# Patient Record
Sex: Female | Born: 1940 | Race: Black or African American | Hispanic: No | Marital: Single | Smoking: Never smoker
Health system: Southern US, Community
[De-identification: ages and names within clinical notes are randomized; demographics above are authoritative.]

## PROBLEM LIST (undated history)

## (undated) DIAGNOSIS — D649 Anemia, unspecified: Secondary | ICD-10-CM

## (undated) DIAGNOSIS — Z9289 Personal history of other medical treatment: Secondary | ICD-10-CM

## (undated) DIAGNOSIS — R1310 Dysphagia, unspecified: Secondary | ICD-10-CM

## (undated) DIAGNOSIS — J45909 Unspecified asthma, uncomplicated: Secondary | ICD-10-CM

## (undated) DIAGNOSIS — K219 Gastro-esophageal reflux disease without esophagitis: Secondary | ICD-10-CM

## (undated) DIAGNOSIS — M1990 Unspecified osteoarthritis, unspecified site: Secondary | ICD-10-CM

## (undated) DIAGNOSIS — F329 Major depressive disorder, single episode, unspecified: Secondary | ICD-10-CM

## (undated) DIAGNOSIS — F32A Depression, unspecified: Secondary | ICD-10-CM

## (undated) DIAGNOSIS — I10 Essential (primary) hypertension: Secondary | ICD-10-CM

## (undated) HISTORY — PX: EYE SURGERY: SHX253

## (undated) HISTORY — PX: TOOTH EXTRACTION: SUR596

## (undated) HISTORY — PX: ABDOMINAL HYSTERECTOMY: SHX81

---

## 1999-09-28 ENCOUNTER — Encounter: Payer: Self-pay | Admitting: Internal Medicine

## 1999-09-28 ENCOUNTER — Encounter: Admission: RE | Admit: 1999-09-28 | Discharge: 1999-09-28 | Payer: Self-pay | Admitting: Internal Medicine

## 2000-04-05 ENCOUNTER — Encounter: Payer: Self-pay | Admitting: Internal Medicine

## 2000-04-05 ENCOUNTER — Encounter: Admission: RE | Admit: 2000-04-05 | Discharge: 2000-04-05 | Payer: Self-pay | Admitting: Internal Medicine

## 2001-03-27 ENCOUNTER — Ambulatory Visit (HOSPITAL_BASED_OUTPATIENT_CLINIC_OR_DEPARTMENT_OTHER): Admission: RE | Admit: 2001-03-27 | Discharge: 2001-03-27 | Payer: Self-pay | Admitting: Internal Medicine

## 2001-04-14 ENCOUNTER — Ambulatory Visit (HOSPITAL_COMMUNITY): Admission: RE | Admit: 2001-04-14 | Discharge: 2001-04-14 | Payer: Self-pay | Admitting: Internal Medicine

## 2001-06-05 ENCOUNTER — Ambulatory Visit (HOSPITAL_COMMUNITY): Admission: RE | Admit: 2001-06-05 | Discharge: 2001-06-05 | Payer: Self-pay | Admitting: Orthopaedic Surgery

## 2001-06-05 ENCOUNTER — Encounter: Payer: Self-pay | Admitting: Orthopaedic Surgery

## 2002-01-30 ENCOUNTER — Encounter: Payer: Self-pay | Admitting: Emergency Medicine

## 2002-01-30 ENCOUNTER — Emergency Department (HOSPITAL_COMMUNITY): Admission: EM | Admit: 2002-01-30 | Discharge: 2002-01-30 | Payer: Self-pay | Admitting: Emergency Medicine

## 2002-06-11 ENCOUNTER — Encounter: Payer: Self-pay | Admitting: Emergency Medicine

## 2002-06-11 ENCOUNTER — Emergency Department (HOSPITAL_COMMUNITY): Admission: EM | Admit: 2002-06-11 | Discharge: 2002-06-11 | Payer: Self-pay | Admitting: Emergency Medicine

## 2003-05-30 ENCOUNTER — Ambulatory Visit (HOSPITAL_COMMUNITY): Admission: RE | Admit: 2003-05-30 | Discharge: 2003-05-30 | Payer: Self-pay | Admitting: Gastroenterology

## 2003-12-12 ENCOUNTER — Encounter: Admission: RE | Admit: 2003-12-12 | Discharge: 2003-12-12 | Payer: Self-pay | Admitting: Internal Medicine

## 2006-10-13 ENCOUNTER — Encounter: Admission: RE | Admit: 2006-10-13 | Discharge: 2006-10-13 | Payer: Self-pay | Admitting: Internal Medicine

## 2006-10-26 ENCOUNTER — Encounter: Admission: RE | Admit: 2006-10-26 | Discharge: 2006-10-26 | Payer: Self-pay | Admitting: Internal Medicine

## 2007-12-06 ENCOUNTER — Encounter: Admission: RE | Admit: 2007-12-06 | Discharge: 2007-12-06 | Payer: Self-pay | Admitting: Internal Medicine

## 2008-02-29 ENCOUNTER — Inpatient Hospital Stay (HOSPITAL_COMMUNITY): Admission: RE | Admit: 2008-02-29 | Discharge: 2008-03-07 | Payer: Self-pay | Admitting: Orthopaedic Surgery

## 2008-03-04 ENCOUNTER — Encounter (INDEPENDENT_AMBULATORY_CARE_PROVIDER_SITE_OTHER): Payer: Self-pay | Admitting: Orthopaedic Surgery

## 2008-03-04 ENCOUNTER — Ambulatory Visit: Payer: Self-pay | Admitting: Vascular Surgery

## 2008-04-06 ENCOUNTER — Inpatient Hospital Stay (HOSPITAL_COMMUNITY): Admission: EM | Admit: 2008-04-06 | Discharge: 2008-04-09 | Payer: Self-pay | Admitting: Emergency Medicine

## 2008-04-09 ENCOUNTER — Inpatient Hospital Stay (HOSPITAL_COMMUNITY): Admission: RE | Admit: 2008-04-09 | Discharge: 2008-04-17 | Payer: Self-pay | Admitting: Psychiatry

## 2008-04-09 ENCOUNTER — Ambulatory Visit: Payer: Self-pay | Admitting: Psychiatry

## 2008-09-20 HISTORY — PX: JOINT REPLACEMENT: SHX530

## 2009-11-18 ENCOUNTER — Encounter: Admission: RE | Admit: 2009-11-18 | Discharge: 2009-11-18 | Payer: Self-pay | Admitting: Internal Medicine

## 2010-04-07 ENCOUNTER — Encounter: Admission: RE | Admit: 2010-04-07 | Discharge: 2010-04-07 | Payer: Self-pay | Admitting: Internal Medicine

## 2010-05-02 ENCOUNTER — Emergency Department (HOSPITAL_COMMUNITY)
Admission: EM | Admit: 2010-05-02 | Discharge: 2010-05-03 | Payer: Self-pay | Source: Home / Self Care | Admitting: Emergency Medicine

## 2010-10-11 ENCOUNTER — Encounter: Payer: Self-pay | Admitting: Internal Medicine

## 2011-02-02 NOTE — H&P (Signed)
[name], [name]              ACCOUNT NO.:  [id number]   MEDICAL RECORD NO.:  [id number]          PATIENT TYPE:  IPS   LOCATION:  0300                          FACILITY:  [facility]   PHYSICIAN:  Geoffery Lyons, M.D.      DATE OF BIRTH:  07-18-1941   DATE OF ADMISSION:  04/09/2008  DATE OF DISCHARGE:                       PSYCHIATRIC ADMISSION ASSESSMENT   IDENTIFYING INFORMATION:  A 70 year old African American female who is  single.  This is a voluntary admission.   HISTORY OF PRESENT ILLNESS:  First psychiatric admission for this 18-  year-old who had a previous history of depression and more recently has  been in crisis since her employer discharged her after she had a total  knee replacement.  Admitted to [facility] on July 18 after  taking an overdose of Coumadin, Norvasc and benzodiazepines.  She says  today it was stupid referring to the overdose but admits that she has  been very depressed and upset.  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  She admits that she is devastated  and upset that her employer will not allow her to return, in fact, has  employed someone else.  She says that this was a shock to her, that the  employer had been hinting around at this for about 3 months, but she did  not expect that she would be completely terminated.  She receives a  little bit of money and social security every month but not enough to  live on and is not sure how she is going to survive.  She endorses for  the past 2-3 weeks frequent crying episodes, hopelessness, some  agitation and anger towards her employer, and anhedonia.  Having  difficulty eating or sleeping well but denies losing any weight.  She  denies active suicidal thoughts today.  No history of substance abuse.  She admits that she had some fleeting homicidal thoughts towards her  employer but none today.   PAST PSYCHIATRIC HISTORY:  The patient previously treated in the past  for major depression, has received psychiatric care in the past from Dr.  Excell Seltzer here in [place] and then Willey Blade, her primary care  practitioner, in the past.  Has taken Prozac over the years and most  recently had Wellbutrin added by her current primary care physician, Dr.  Kirby Funk.  One prior suicide attempt many years ago.   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  Has 1 sister in [place] and is  particularly close to her older sister who lives in [place].  Also  close to her brother who lives in [place] along with her sister-in-  Social worker.  No current legal problems.  Does have her own home with limited  Social Security income.   FAMILY HISTORY:  Denies a family history of depression and substance  abuse.   MEDICAL HISTORY:  PCP is Dr. Kirby Funk.  Also cared for by Dr.  Whitfield, her orthopedist.   CURRENT MEDICAL PROBLEMS:  1. 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  2. Osteoarthritis.  3. History of acute renal failure.  4. Some hypertension.  5. Post polypharmacy overdose.   CURRENT MEDICATIONS:  1. Prozac 40 mg daily.  2. Recently added Wellbutrin XL 150 mg daily.  3. Tramadol 50 mg q.6.h p.r.n. for knee pain.  4. Recently discontinued oxycodone for knee pain.  5. Symbicort 160/4.5 two inhalations b.i.d.  6. Flonase 1 puff each nostril daily.  7. Amlodipine 5 mg daily.  8. Lisinopril hydrochlorothiazide 20 mg/12.5 mg daily.  9. K-Dur 10 mg daily.  10.Tranxene 3.75 mg not daily, occasionally will take 1 at night for      insomnia.   PHYSICAL EXAMINATION:  Was done on the medical unit and is documented in  the record.   DIAGNOSTIC STUDIES:  She was initially hypokalemic.  Her metabolic panel  now normal.  Her previous INR was elevated on admission since she had  overdosed on the Coumadin but is not currently taking it.   She was  treated with oral vitamin K and INR normalized.  Routine urinalysis  within normal limits.  Urine drug screen positive for benzodiazepines,  negative for all other substances.  Alcohol level less than 5 on  admission and salicylate level less than 4.   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  Mood is depressed, grieving loss  of the relationship.  Says regarding the overdose that it was stupid.  Denies active suicidal thoughts today.  Does not want to talk any more  to the employer; says she still struggling with the grief.  No active  suicidal or homicidal thoughts today.  Thinking is logical, coherent,  goal directed.  No guarding.  No flight of ideas or signs of confusion.  No evidence of psychosis.  Immediate, recent remote memory are intact.  Insight is good.  She regrets the overdose.   AXIS I:  Major depression recurrent, severe.  AXIS II:  No diagnosis.  AXIS III:  Status post polypharmacy overdose post total knee replacement  1 month ago.  Hypertension.  AXIS IV:  Severe issues with employment issues, relationship conflicts  and caregiving stress.  AXIS V:  Current is 49; past year estimated at 29.   PLAN:  Is to admit her to help alleviate any suicidal thoughts, help  stabilizer her.  Will hope to get a family session with her sister, and  she has agreed to allow [place] to call her.  On the medical unit, they had  discontinued the hydrochlorothiazide and the potassium and continued  only the lisinopril, and now her blood pressure continues to be normal  on lisinopril only which we will continue.  We are er going to continue  her Prozac 40 mg daily along with the Wellbutrin XL.  Estimated  length  of stay is 5 days.     Margaret A. Scott, N.P.      Geoffery Lyons, M.D.  Electronically Signed   MAS/MEDQ  D:  04/10/2008  T:  04/10/2008  Job:  [id number]

## 2011-02-02 NOTE — Op Note (Signed)
[name], [name]              ACCOUNT NO.:  [id number]   MEDICAL RECORD NO.:  [id number]          PATIENT TYPE:  INP   LOCATION:  5036                         FACILITY:  [facility]   PHYSICIAN:  Claude Manges. Whitfield, M.D.DATE OF BIRTH:  July 26, 1941   DATE OF PROCEDURE:  02/29/2008  DATE OF DISCHARGE:                               OPERATIVE REPORT   PREOPERATIVE DIAGNOSIS:  End-stage osteoarthritis, right knee.   POSTOPERATIVE DIAGNOSIS:  End-stage osteoarthritis, right knee.   PROCEDURE:  Computer-assisted right total knee replacement.   SURGEON:  Claude Manges. Cleophas Dunker, MD.   ASSISTANT:  Karie Chimera, PA-C.   ANESTHESIA:  General with supplemental femoral nerve block.   COMPLICATIONS:  None.   COMPONENTS:  DePuy LCS standard femoral component, a #2.5 tibial tray  with a 12.5-mm bridging bearing, and a metal backed three peg rotating  patella over secured polymethyl methacrylate.   PROCEDURE:  The patient was met in the holding area.  The right lower  extremity was marked as the appropriate operative extremity.  Any  questions were answered.   The patient was then taken to room 4, where she was placed under general  orotracheal anesthesia.  Nursing staff inserted a Foley catheter with  clear yellow urine.  She did receive a preoperative femoral nerve block.   Tourniquet was then applied.  The leg was prepped with Betadine scrub  and DuraPrep from the tourniquet to the midfoot.  Sterile draping was  performed.   The extremity still elevated with Esmarch, exsanguinated with the  proximal tourniquet at 350 mmHg.   A midline longitudinal incision was made centered about the patella  extending from the superior pouch of the tibial tubercle via a sharp  dissection.  Incision carried down to the subcutaneous tissue.  The  first layer of capsule was incised in midline.  A medial parapatellar  incision was then made to the deep capsule with the Bovie.  The patella  was everted 180  degrees and the knee flexed to 90 degrees.  There were  large osteophytes along the medial and lateral femoral condyle.  There  was completely absence of articular cartilage of both femoral condyles.  The lateral femoral condyle onto the lateral tibial plateau onto a great  extent the medial tibial plateau.  The patient also had about 15 degrees  of valgus, but I could correct it to neutral.   We elected to use computer assistance.   Two Shantz pins were then placed in the distal femur and two in the  proximal tibia.  The computer arrays were then applied.   We initially established the normal anatomic axis and then our focal  points were morphed in the tibia and femur.  We had templated a standard  femoral component to computer agree.  We also determined that we had a  #2.5 tibial tray and the computer agree with that.   Initial cut was then made transversing the proximal tibia based on the  computer points.  The verifier was applied with an excellent cut in 7  degrees of posterior declination.  We then established flexion and  extension gaps.  We were tight laterally because of the valgus position  and lateral release was performed below the tibia and then aligning  flexion and extension gaps within a millimeter.   Subsequent cuts were then made on the femur based on the computer points  and we checked with the verifier at each step.  The final tapering cuts  were then made on the tibia.   Retractors were then placed about the tibia.  Center hole was then made  followed by the keeled cut.  We tried a #3 and then a 2.5 tibial tray to  2.5 with an anatomic fit.  The center hole was then made followed by the  keel cut.  The #12.5 polyethylene bearing was then inserted followed by  the standard femoral component, and through a full range of motion, we  had full extension, no opening with varus or valgus stress.  We did not  appear to be tight medially or laterally.   Prior to  performing the tibial cuts, I did insert the lamina spreaders  on both sides of the joint and removed any remnants of medial and  lateral menisci as well as ACL and PCL.  There were no osteophytes  behind either femoral condyle.   The patella was then prepared by removing 10 mm of bone leaving 12 mm of  patella thickness.  The patella jig was applied.  Three holes made.  The  trial patella was applied and then through full range of motion did not  sublux medially or laterally.   The trial components removed.  We used FloSeal behind in the posterior  aspect of the knee and then inserted the final components.  The 2.5  tibial tray was impacted with polymethyl methacrylate.  Extraneous  methacrylate was removed from around its periphery.  The 12.5-mm  polyethylene bridging bearing was inserted followed by the cemented  standard femoral component.  Again, extraneous methacrylate was removed  from the femur.   The patella was applied with methacrylate and the patellar clamp.  The  knee was placed in extension and the methacrylate was allowed to mature.  Joint was then inspected.  Patellar clamp was removed and any further  hardened extraneous methacrylate was removed with an osteotome.  FloSeal  was then placed around the joint.  We injected Marcaine with epinephrine  into the deep capsule.  After approximately 5 minutes, the tourniquet  was deflated.  We had very nice hemostasis.  We had nice capillary  refill to all the joint surfaces and skin edges.  The Shantz pins were  removed.  Drill holes were irrigated with saline solution.   Any further bleeding was controlled with the Bovie.   The deep capsule was then closed with interrupted #1 Ethibond.  There  was partial avulsion of the patellar tendon, so there we inserted a  single two-prong Mitek anchor.  Deep capsular closure was then continued  with #1 Ethibond.   The superficial capsule closed with running 0 Vicryl, subcu with 2-0   Vicryl, and skin closed with skin clips.  Sterile bulky dressing was  applied followed by the patient's support stocking.   The patient tolerated the procedure well without complications.      Claude Manges. Cleophas Dunker, M.D.  Electronically Signed     PWW/MEDQ  D:  02/29/2008  T:  03/01/2008  Job:  [id number]

## 2011-02-02 NOTE — Discharge Summary (Signed)
[name], [name]              ACCOUNT NO.:  [id number]   MEDICAL RECORD NO.:  [id number]          PATIENT TYPE:  INP   LOCATION:  5036                         FACILITY:  [facility]   PHYSICIAN:  Claude Manges. Whitfield, M.D.DATE OF BIRTH:  1941/03/18   DATE OF ADMISSION:  02/29/2008  DATE OF DISCHARGE:  03/07/2008                               DISCHARGE SUMMARY   ADMISSION DIAGNOSIS:  Osteoarthritis of the right knee.   DISCHARGE DIAGNOSES:  1. Osteoarthritis of the right knee.  2. History of asthma.  3. History of hypertension.  4. History of sleep apnea.  5. History of anxiety.  6. Acute blood loss anemia.  7. Esophageal reflux.  8. Acute renal failure.  9. Hypertension.  10.Hypokalemia.   PROCEDURE:  Right total knee arthroplasty.   HISTORY:  [name] is a very pleasant 70 year old African American female  with chronic right knee pain for several years.  She had arthroscopic  debridement 6 years ago.  Recently, her pain is now severe, constant,  throbbing and aching.  It is not relieved by narcotics.  She has failed  with conservative treatment.  Has radiographic end-stage OA with marked  valgus deformity.  Now indicated for right total knee arthroplasty.   HOSPITAL COURSE:  A 70 year old African American female admitted February 29, 2008, and after appropriate laboratory studies were obtained as well  as 2 grams of Ancef IV on-call to the operating room, was taken to the  operating room where she underwent a right total knee arthroplasty.  She  tolerated the procedure well.  She was placed on Dilaudid PCA reduced  dose pain management system.  Started on Lovenox 30 mg subcu q.12 h.  Starting on March 01, 2008, at 8:00 a.m.  Coumadin per pharmacy protocol.  Foley was placed intraoperatively.  CPM placed 0-60 degrees for 6-8  hours per day increasing 10 degrees per day.  Consult PT was obtained,  partial weightbearing 50%.  Dr. Kirby Funk was consulted and [facility]  assumed medical care.  She was noted to have what appeared  to be some renal changes.  She initially was started with a GFR greater  than 60.  On the March 01, 2008, it dropped to 37 and then went to 20 as  well.  At the time of her discharge though it was back to greater than  60.  On the March 01, 2008, her hydrochlorothiazide, as well as her  lisinopril was held.  Norvasc was held if systolic pressure less than  100.  IV fluid was changed to normal saline at 100 an hour.  IV rate was  increased to 125 mL/hour on the March 02, 2008.  Her dressing was  changed.  Her wound was stable.  She was also started on iron sulfate  325 b.i.d.  The PCA was discontinued.  On the March 03, 2008, her IV rate  was decreased to 75.  She was given MiraLax 17 g daily through water and  she was given 1 unit of packed cells during her hospital course.  She  was also given a second  unit on the March 03, 2008, packed RBCs.  Her  Lovenox was discontinued on the March 03, 2008, when her protime became  prophylactic.  Doppler was ordered on the March 04, 2008, and a  urinalysis was obtained from the Foley.  Her lisinopril was restarted  and her Foley was discontinued and the IV was saline lock on the March 04, 2008.  She did have constipation, and she was tried with MiraLax, as  well as a Dulcolax suppository.  Hypokalemia also occurred on March 05, 2008, and she was given 20 mEq of KCl p.o. b.i.d..  She was then allowed  to bear full weight on her right leg.  The remainder of her hospital  course was uneventful and it was felt that she was medically stable and  was able to be discharged to home on the March 07, 2008.  EKG was read as  normal sinus rhythm, normal EKG.   LABORATORY STUDIES:  Hemoglobin 12.9, hematocrit 8.1%, white count 6900,  and platelets 262,000.  She dropped to a hemoglobin of 8.0 and  hematocrit 23.1% on the March 03, 2008.  Discharge hemoglobin 10.9,  hematocrit 32.2%, white count 8400, and  platelets 233,000.  Initial  protime 12.6, INR 0.9, PTT 27.  Discharge protime 34.3, INR 3.2.  Preop  sodium 141, potassium 3.2, chloride 104, CO2 29, glucose 80, BUN 12,  creatinine 1.03.  She dropped to a potassium of 3.1 on the March 05, 2008.  Her creatinine was 1.65 on the March 01, 2008, and 2.85 on the  March 02, 2008, with a BUN of 32.  On the March 03, 2008, her creatinine  lowered to 1.78, the BUN was 22 and then she had normal values from that  date.  Discharge sodium 142, potassium 4.1, chloride 107, CO2 28,  glucose 99, BUN 11, and creatinine 0.96.  GFR is as noted above started  at greater than 60 preoperatively, dropped to 20 on the March 02, 2008,  and returned to normal by the time of her discharge greater than 60.  Preop total protein 6.8, albumin 3.7, AST 18, ALT 13, ALP 76, and total  bilirubin 0.5.  Urinalysis February 22, 2008, revealed positive nitrites,  moderate leukocyte esterase, rare epithelium, 7-10 whites, 0-2 reds,  many bacteria.  Urinalysis of February 29, 2008, revealed trace of leukocyte  esterase, rare epithelium, 0-2 rare wbc clusters, 0-2 red cells.  No  bacteria noted.  On March 04, 2008, urinalysis showed small amount of  hemoglobin, protein at 30, trace leukocyte esterase, rare epithelium, 0-  2 whites, 3-6 reds and rare bacteria.  Urine culture February 22, 2008,  revealed greater than 1000 colonies of E-coli sensitive to cefazolin.  Urine culture February 29, 2008 revealed no growth.  Culture of February 29, 2008, again showed no growth.   DISCHARGE INSTRUCTIONS:  Low sodium heart healthy diet was indicated.  Keep her incision clean and dry.  Cover with sterile dressings.  Increase her activity slowly.  Use a walker.  Weightbearing as  tolerated.  CPM 0 to 80 degrees for 6-8 hours per day and then increase  5-10 degrees per day.  May shower.  No lifting or driving for 6 weeks.  Prescription for Coumadin 5 mg as directed by advanced pharmacist,  Percocet 5/325 one to two  tabs every 4 hours as needed for pain, Robaxin  500 mg p.o. q.8 h. p.r.n. spasms, Colace 100 mg b.i.d., MiraLax 17 g  daily  as needed for constipation.  Follow up with Dr. Cleophas Dunker on March 13, 2008.   DISCHARGE:  Good condition.      Oris Drone Petrarca, P.A.-C.      Claude Manges. Cleophas Dunker, M.D.  Electronically Signed    BDP/MEDQ  D:  04/02/2008  T:  04/02/2008  Job:  [id number]

## 2011-02-02 NOTE — H&P (Signed)
[name], [name]              ACCOUNT NO.:  [id number]   MEDICAL RECORD NO.:  [id number]          PATIENT TYPE:  INP   LOCATION:  0101                         FACILITY:  [facility]   PHYSICIAN:  Hollice Espy, M.D.DATE OF BIRTH:  [birth year]   DATE OF ADMISSION:  04/06/2008  DATE OF DISCHARGE:                              HISTORY & PHYSICAL   PRIMARY CARE PHYSICIAN:  Thora Lance, M.D.   CHIEF COMPLAINT:  Overdose.   HISTORY OF PRESENT ILLNESS:  The patient is a 70 year old African  American female with past medical history of hypertension, depression  and a recent total knee replacement done, for which she was put on  Coumadin.  She says that the last few weeks have been rather stressful  following her knee surgery and then she had a sister who was staying  with her for the first week after her knee surgery, which was good, but  then after she left she started getting very depressed.  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  Because of this termination, she felt that this was just the  final straw and she could not take it anymore and did not want to live,  and so she took a bunch of medications.  These appear to be, she took a  number of Coumadin pills as well as Norvasc and benzodiazepines of  unknown number.  As soon as she stated that she took it, she felt that  this was the wrong thing to do and she came into the emergency room.  In  the emergency room she had her INR checked.  It was found to be 2.0.  However, her INR has been followed on a daily basis and her previous INR  1 day ago was 3.2. The rest of her labs were essentially unremarkable.  Her urine drug screen was positive for benzodiazepine, negative for  opiates.  The ER attending spoke with Poison Control, who recommended  p.o. vitamin K and continued monitoring.  Currently the patient is  doing  better.  She is somewhat tearful about the whole of the event.  Denies  any headaches, vision changes, dysphagia, chest pain, palpitations,  shortness of breath, wheeze, cough, abdominal pain, hematuria, dysuria,  constipation, diarrhea, focal extremity numbness, weakness or pain other  than some right knee pain from where her surgery was.   REVIEW OF SYSTEMS:  Otherwise negative.   PAST MEDICAL HISTORY:  Includes status post recent total knee done  several weeks ago, hypertension, depression.   MEDICATIONS:  She is on Norvasc 5, Wellbutrin 150, clorazepate 3.375  p.o. p.r.n., Prozac 40, lisinopril/hydrochlorothiazide 20/12.5 p.o.  daily, methocarbamol 500 p.o. daily p.r.n., Percocet 5/325 p.o. b.i.d.,  K-Dur 10 mg p.o. daily.   ALLERGIES:  NO KNOWN DRUG ALLERGIES.   SOCIAL HISTORY:  She denies any tobacco, alcohol or drug use.   FAMILY HISTORY:  Noncontributory.   PHYSICAL EXAMINATION:  VITAL SIGNS:  The patient's vitals on admission,  temperature 99.8, heart 81, blood pressure 115/69, respirations 13, O2  sat 97% on room air.  GENERAL:  She is alert and oriented x3.  She is somewhat tearful.  HEENT:  Normocephalic atraumatic.  Mucous membranes are moist.  She has  no carotid bruits.  HEART:  Regular rate rhythm.  S1 and S2.  LUNGS:  Clear to auscultation bilaterally.  ABDOMEN:  Soft, obese, nontender, positive bowel sounds.  EXTREMITIES:  Left lower extremity, about a trace pitting edema, right  is about a 1+ pitting edema.   LAB WORK:  White count 6.3, H and H 11.1 and 33, MCV of 87, platelet  count 257, no shift.  Sodium 142, potassium 3, chloride 107, bicarb 26,  BUN 9, creatinine 1.18, glucose 118.  LFTs are noted for an albumin of  3, alcohol level less than 5.  INR is 2.  Previous INR 1 day ago was  3.2.  UA is negative.  Urine drug screen is positive for  benzodiazepines.  Tylenol and salicylate levels are within normal  limits.   ASSESSMENT AND PLAN:  1.  Suicide attempt where the patient took intentional overdose of      Norvasc and Coumadin, and possibly benzodiazepines.  Telemetry bed,      suicide precautions, 24-hour sitter.  I have consulted psychiatry,      who will come see the patient.  Will follow the patient's PT/INR      and treat with vitamin K.  2. Hypertension.  Holding p.o. medicines.  3. Recent knee surgery.  4. Hypokalemia.  Will replace.  Hollice Espy, M.D.  Electronically Signed     SKK/MEDQ  D:  04/06/2008  T:  04/06/2008  Job:  [id number]   cc:   Thora Lance, M.D.  Fax: [phone]   Claude Manges. Cleophas Dunker, M.D.  Fax: [phone]

## 2011-02-02 NOTE — Discharge Summary (Signed)
NAMEVASTIE, [name]              ACCOUNT NO.:  [id number]   MEDICAL RECORD NO.:  [id number]          PATIENT TYPE:  INP   LOCATION:  1503                         FACILITY:  [facility]   PHYSICIAN:  Thora Lance, M.D.  DATE OF BIRTH:  1941/03/18   DATE OF ADMISSION:  04/06/2008  DATE OF DISCHARGE:  04/09/2008                               DISCHARGE SUMMARY   REASON FOR ADMISSION:  A 70 year old African female who had been  terminated from long term employment had become depressed and had taken  a number of Coumadin as well as Norvasc and benzodiazepines in a suicide  attempt.  She then came to the emergency room after realizing she had  done the wrong thing.  She was given vitamin K p.o. in the emergency  room and admitted to the hospital.   SIGNIFICANT FINDINGS:  Blood pressure 115/69.  Heart rate 81.  Temperature 98.8.  LUNGS:  Clear.  HEART:  Regular rate and rhythm.  EXTREMITIES:  Showed trace edema.   LABORATORY WORK:  WBC 6.3, hemoglobin 11.1, platelet count 257, sodium  142, potassium 3, chloride 107, bicarbonate 26, BUN 9, creatinine 1.18,  glucose 118, alcohol level less than 5, INR was 2.  Urine drug screen  positive for benzodiazepines.  Tylenol, salicylate level is normal.   HOSPITAL COURSE:  The patient was admitted with a suicide attempt.  She  was monitored on telemetry and given IV fluids.  Her antihypertensives  and antidepressants were held.  Her Coumadin was continued which had  been taken temporarily post a knee replacement.  The patient had a 24-  hour sitter with her during the hospitalization.  She remained medically  stable with blood pressure remaining in the normal/low range but not  below 90.  Her antihypertensives were not yet restarted.  She was  restarted on her antidepressants at discharge.  Her telemetry was  normal.  Her potassium was repleted p.o.  Her asthma remained stable on  her outpatient bronchodilator/steroid combination.  She was seen by  Dr.  Jeanie Sewer of psychiatry who felt that she still had significant suicide  risk and recommended transfer to the psychiatric service when medically  stable.   DISCHARGE DIAGNOSES:  1. Drug overdose.  2. Depression.  3. Hypertension.  4. Asthma.  5. Status post knee replacement.  6. Overactive bladder.  7. Allergic rhinitis.   PROCEDURES:  None.   DISCHARGE MEDICATIONS:  1. Fluoxetine 40 mg every day.  2. Wellbutrin XL 150 mg every day.  3. Tramadol 50 mg 1 p.o. q.6 p.r.n. for pain.  4. Symbicort 160/4.5 2 inhalations b.i.d.  5. Flonase each nostril every day.  6. Amlodipine 5 mg every day and lisinopril-hydrochlorothiazide 25 mg      every day were held at discharge as blood pressure was still      112/70.  As blood pressure rises greater than 140/90, these      antihypertensives should be restarted.  7. Coumadin was discontinued.   DISPOSITION:  To [facility].   DIET:  Low sodium diet.   ACTIVITY:  As tolerated.  FOLLOWUP:  With Dr. Valentina Lucks at next regularly scheduled appointment.           ______________________________  Thora Lance, M.D.     JJG/MEDQ  D:  04/09/2008  T:  04/09/2008  Job:  [id number]

## 2011-02-02 NOTE — Consult Note (Signed)
NAMEKENISHIA, PLACK              ACCOUNT NO.:  [id number]   MEDICAL RECORD NO.:  [id number]          PATIENT TYPE:  INP   LOCATION:  1503                         FACILITY:  [facility]   PHYSICIAN:  Antonietta Breach, M.D.  DATE OF BIRTH:  1940/10/27   DATE OF CONSULTATION:  04/08/2008  DATE OF DISCHARGE:                                 CONSULTATION   REQUESTING PHYSICIAN:  Kirby Funk.   HISTORY OF PRESENT ILLNESS:  [name] is a 70 year old female  admitted to the [facility] on April 06, 2008, due to an  overdose.   Ms. Trautmann has been experiencing a number of stresses.  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   The patient has developed a return of depressive symptoms over the past  week including easy crying, hopelessness, anhedonia, depressed mood.   On April 06, 2008, she took an overdose of a number of medications  including Norvasc and Coumadin with the goal of killing herself.   She continues with the same depressive symptoms.   PAST PSYCHIATRIC HISTORY:  Ms. Phebus does have a history of a prior  suicide attempt.  She began to experience major depression approximately  10 years ago.  She has required at least 3 psychiatric admissions.   Her medication treatment over the years has involved Prozac, augmented  with Wellbutrin.   In review of the past medical record, anxiety is also listed.   The patient's psychotropic medications at the time of admission involved  Prozac 40 mg q. day, Wellbutrin 150 mg q. day and  clorazepate 3.375 mg  p.r.n.   FAMILY PSYCHIATRIC HISTORY:  None known.   SOCIAL HISTORY:  Please see the above.  The patient does not use alcohol  or illegal drugs.  She has a sister in [place] who is very supportive.  The patient is single.  She has no children.  The patient does have  strong church support.   PAST MEDICAL HISTORY:  1. Status post total knee replacement approximately 2 months ago.  2. Hypertension.  3. The patient was taking Coumadin initially after the knee      replacement; however, the Coumadin that she overdosed on was      leftover Coumadin.  She was not requiring ongoing Coumadin by her      general medical healthcare providers   MEDICATIONS:  The MAR is reviewed.  The patient currently is on Xanax  0.5 mg q.6 hours p.r.n.   ALLERGIES:  ASPIRIN.   LABORATORY DATA:  Sodium 143, BUN 6, creatinine 0.89, glucose 102, INR  2.5 which is an increase currently from  a previous INR.  SGOT 13, SGPT  9, WBC 5.2, hemoglobin 10.4, platelet count 229.  Urine drug screen was  positive for benzodiazepines only.  Aspirin, alcohol, Tylenol all  negative.   REVIEW OF SYSTEMS:  CONSTITUTIONAL:  HEAD, EYES, EARS, NOSE AND THROAT,  MOUTH, NEUROLOGIC, PSYCHIATRIC, CARDIOVASCULAR, RESPIRATORY,  GASTROINTESTINAL, GENITOURINARY, SKIN, MUSCULOSKELETAL, HEMATOLOGIC,  LYMPHATIC, ENDOCRINE, METABOLIC all unremarkable.   PHYSICAL EXAMINATION:  VITAL SIGNS:  Temperature 98.5.  Pulse 77.  Respiratory rate 18.  Blood pressure 131/80.  O2 saturation on room air  98%.  GENERAL APPEARANCE:  Ms. Willette is an elderly female lying in a supine  position in her hospital bed with no abnormal involuntary movements.  MENTAL STATUS EXAM:  Ms. Buescher is alert, she is oriented to all  spheres.  Her attention span is mildly decreased.  Her concentration is  mildly decreased.  Her affect is constricted.  Her mood is depressed.  She cries often during the interview.  Memory is intact to  immediate  recent and remote.  Her fund of knowledge and intelligence are within  normal limits.  Her speech involves normal rate and prosody.  There is  no dysarthria.  Thought process logical, coherent, goal-directed.  No  looseness of associations.  Thought content, the patient acknowledges  suicidal intent.  She has no hallucinations or delusions.  Insight is  intact to the need of treatment for depression.  Her judgment is  impaired for living outside of the supportive environment of the  hospital.   ASSESSMENT:  Axis I:  293.83 mood disorder not otherwise specified,  depressed.  Rule out major depressive disorder recurrent severe 296.33.  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  Axis II:  Deferred.  Axis III:  See general medical in the past medical history above.  Axis IV:  General medical primary support group occupational.  Axis V:  30.   Ms. Heatherly is still at risk to harm herself.   The undersigned provided ego supportive psychotherapy and education.   RECOMMENDATIONS:  1. Would continue suicide precautions.  2. Psychotropic medications deferred.  3. Would admit to an inpatient psychiatric unit for further evaluation      and treatment as soon as possible when the patient is medically      cleared.      Antonietta Breach, M.D.  Electronically Signed     JW/MEDQ  D:  04/08/2008  T:  04/08/2008  Job:  [id number]

## 2011-02-05 NOTE — Op Note (Signed)
NAME:  [name], [name]                        ACCOUNT NO.:  [id number]   MEDICAL RECORD NO.:  [id number]                   PATIENT TYPE:  AMB   LOCATION:  ENDO                                 FACILITY:  [facility]   PHYSICIAN:  Danise Edge, M.D.                DATE OF BIRTH:  Sep 01, 1941   DATE OF PROCEDURE:  05/30/2003  DATE OF DISCHARGE:                                 OPERATIVE REPORT   PROCEDURE PERFORMED:  Esophagogastroduodenoscopy and colonoscopy.   ENDOSCOPIST:  Charolett Bumpers, M.D.   INDICATIONS FOR PROCEDURE:  Ms. [name] is a 70 year old female born  Dec 12, 1940.  Ms. [name] is scheduled to undergo a diagnostic  esophagogastroduodenoscopy to evaluate chronic indigestion and  gastroesophageal reflux.  She is also scheduled to undergo a screening  colonoscopy with polypectomy to prevent colon cancer.   PREMEDICATION:  Versed 10 mg, Demerol 50 mg.   PROCEDURE:  Esophagogastroduodenoscopy.  After obtaining informed consent,  the patient was placed in the left lateral decubitus position.  I  administered intravenous Demerol and intravenous Versed to achieve conscious  sedation for the procedure.  The patient's blood pressure, oxygen  saturations and cardiac rhythm were monitored throughout the procedure and  documented in the medical record.   The Olympus gastroscope was passed through the posterior hypopharynx into  the proximal esophagus without difficulty.  The hypopharynx, larynx, and  vocal cords appeared normal.   Esophagoscopy:  The proximal, mid and lower segments of the esophageal  mucosa appeared completely normal.   Gastroscopy:  Retroflex view of the gastric cardia and fundus was normal.  The gastric body, antrum and pylorus appeared normal.   Duodenoscopy:  The duodenal bulb and mid duodenum and distal duodenum  appeared normal.   ASSESSMENT:  Normal esophagogastroduodenoscopy.   PROCEDURE:  Proctocolonoscopy.  Anal inspection was  normal.  Digital rectal  exam was normal.  The Olympus adjustable pediatric colonoscope was  introduced into the rectum and advanced to the cecum.  Colonic preparation  for the exam today was satisfactory.   Rectum:  Normal.   Sigmoid colon and descending colon:  Left colonic diverticulosis.   Splenic flexure:  Normal.   Transverse colon:  Normal.   Hepatic flexure:  Normal.   Ascending colon:  Normal.   Cecum and ileocecal valve:  Normal.   ASSESSMENT:  Normal screening proctocolonoscopy to the cecum except for the  presence of left colonic diverticulosis.  No endoscopic evidence for the  presence of colorectal neoplasia.                                                Danise Edge, M.D.    MJ/MEDQ  D:  05/30/2003  T:  05/31/2003  Job:  [id number]   cc:  Delanna Ahmadi, M.D.  [address] [place] [address]  Fax: [phone]

## 2011-02-05 NOTE — Discharge Summary (Signed)
NAMEONNA, NODAL              ACCOUNT NO.:  [id number]   MEDICAL RECORD NO.:  [id number]          PATIENT TYPE:  IPS   LOCATION:  0300                          FACILITY:  [facility]   PHYSICIAN:  Geoffery Lyons, M.D.      DATE OF BIRTH:  November 13, 1940   DATE OF ADMISSION:  04/09/2008  DATE OF DISCHARGE:  04/17/2008                               DISCHARGE SUMMARY   CHIEF COMPLAINT/HISTORY OF PRESENT ILLNESS:  This was the first  admission to [facility] for this 70 year old African  American female, single, voluntarily admitted.  Past history of  depression.  More recently she had been in crisis since her employer  discharged her after she had a total knee replacement and could not work  for her for awhile.  She was admitted to [facility] on April 06, 2008  after taking an overdose of Coumadin, Norvasc and benzodiazepines.  Endorsed I was too busy regarding to the overdose, but endorsed that  she had been very depressed and upset.  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  They were sad and upset that her  employer would not allow her to return.  In fact, she is employed  somewhere else.  It was a shock to her.  Endorsed that looking back, the  employer had been hinting around this for about 3 months, but she did  not expect to be completely terminated.  Does not have enough funds to  make it from month to month.   PAST PSYCHIATRIC HISTORY:  Previously treated in the past for depression  by Dr. __________.  Sees Dr. Willey Blade, primary care Clemence Lengyel.  Had  taking Prozac more recently.  Wellbutrin added by Dr. Valentina Lucks.   ALCOHOL/DRUG HISTORY:  Denies any active use of any substances.   MEDICAL HISTORY:  1. 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  2. Osteoarthritis.  3. History of acute renal failure.  4. Hypertension.   MEDICATIONS:  1. Prozac 20 mg per day.  2.  Wellbutrin XL 150 mg per day.  3. Tramadol 50 mg every 6 hours as needed.  4. Symbicort 160/4.5 two inhalations twice a day.  5. Flonase 1 puff each nostril daily.  6. Amlodipine 5 mg per day.  7. Lisinopril/hydrochlorothiazide 20/12.5 daily.  8. K-Dur 10 mg per day.  9. Tranxene 10.5 as needed.   LABORATORY WORK:  Metabolic panel within normal limits.  INR elevated  upon admission, but now normal.  UDS positive for benzodiazepines.   PHYSICAL EXAMINATION:  Failed to show any acute findings.   MENTAL STATUS EXAM:  Reveals a fully alert cooperative female, good eye  contact.  Here for getting upset during the discussion.  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  Told that she would be terminated.  Mood is depressed.  Affect depressed, tearful.  Thought processes are logical, coherent and  relevant.  Feeling very overwhelmed, feeling that her  life was over,  confused as where to go from here.  No delusions.  No homicidal ideas.  Cognition well preserved.   AXIS I:  Major depression, recurrent.  AXIS II:  No diagnosis.  AXIS III:  Status post overdose, total knee replacement, hypertension.  AXIS IV:  Moderate.  AXIS V:  On admission 35-40, global assessment of functioning in the  last year 70.   COURSE IN THE HOSPITAL:  She was admitted.  She started individual and  group psychotherapy.  She was placed back on her medications.  She  continued to have a very hard time, very depressed, tearful, hopeless,  helpless, afraid she would act out on thoughts of hurting herself when  she got out of the hospital.  She was hesitant to communicate with the  old employer, but eventually she did that.  It did not go well.  He kind  of reaffirmed the fact that the lady was not going to be supportive of  her, but at least he helped to start giving some closure.  We worked  with coping skills with grief and loss.  There was a family  session with  sister and sister-in-law who were supportive, but the patient perceived  that they really did not understand everything that was going on.  She  continued to have a sense of hopelessness and helplessness, sense of  loss.  Very labile affect, tearful talking about the loss of the  relationship with the caretaker with the person she used to take care  of.  By April 16, 2008, things were changing.  She started feeling that  things could work out.  She was still dealing with the loss of the  relationship with the employer.  There was  uncertainty of the financial  situation.  Endorsed that she understood she needed to work at least for  couple of years due to her financial status, but she was more hopeful.  She was oriented as some other options in terms of getting help out  there.  She made a list of people she needed to call, things that she  needed to do.  By April 17, 2008, she was in full contact with reality.  Mood improved.  Affect brighter.  She was more hopeful.  She had a plan  of how to go about getting her life back together, feeling that she had  given some closure to what happened with the employer.   DISCHARGE DIAGNOSES:  AXIS I:  Major depressive disorder.  AXIS II:  No diagnosis.  AXIS III:  Right knee replacement, hypertension, status post overdose.  AXIS IV: Moderate.  AXIS V:  Upon discharge 55-60.   DISCHARGE MEDICATIONS:  1. Prozac 40 mg per day.  2. Symbicort inhaler 2 puffs in the morning and at bedtime.  3. Flonase spray.  4. Wellbutrin XL 200 mg in the morning.  5. Norvasc 5 mg per day.   FOLLOW UP:  Follow up with [facility]      Geoffery Lyons, M.D.  Electronically Signed    IL/MEDQ  D:  05/16/2008  T:  05/16/2008  Job:  [id number]

## 2011-06-17 LAB — URINALYSIS, MICROSCOPIC ONLY
Ketones, ur: NEGATIVE
Nitrite: NEGATIVE
Protein, ur: 30 — AB
Specific Gravity, Urine: 1.011
Urobilinogen, UA: 0.2
pH: 8

## 2011-06-17 LAB — BASIC METABOLIC PANEL
BUN: 22
BUN: 22
BUN: 32 — ABNORMAL HIGH
BUN: 10
BUN: 8
CO2: 26
CO2: 27
CO2: 28
Calcium: 8.7
Chloride: 102
Chloride: 107
Chloride: 109
Chloride: 105
Chloride: 107
Chloride: 109
Chloride: 109
Creatinine, Ser: 1.65 — ABNORMAL HIGH
Creatinine, Ser: 1.78 — ABNORMAL HIGH
Creatinine, Ser: 0.89
Creatinine, Ser: 0.97
GFR calc Af Amer: 34 — ABNORMAL LOW
GFR calc Af Amer: >60
GFR calc Af Amer: >60
GFR calc non Af Amer: 16 — ABNORMAL LOW
GFR calc non Af Amer: 28 — ABNORMAL LOW
GFR calc non Af Amer: 31 — ABNORMAL LOW
GFR calc non Af Amer: 57 — ABNORMAL LOW
GFR calc non Af Amer: 58 — ABNORMAL LOW
Glucose, Bld: 167 — ABNORMAL HIGH
Glucose, Bld: 103 — ABNORMAL HIGH
Glucose, Bld: 79
Glucose, Bld: 99
Potassium: 3.6
Potassium: 4.4
Potassium: 3.9
Potassium: 4.1
Sodium: 135
Sodium: 141
Sodium: 142

## 2011-06-17 LAB — CROSSMATCH
ABO/RH(D): B POS
Antibody Screen: NEGATIVE

## 2011-06-17 LAB — CBC
HCT: 28.3 — ABNORMAL LOW
HCT: 38.1
Hemoglobin: 9 — ABNORMAL LOW
Hemoglobin: 9.9 — ABNORMAL LOW
Hemoglobin: 10.9 — ABNORMAL LOW
MCHC: 33.8
MCHC: 34
MCHC: 34
MCV: 87
MCV: 87.6
MCV: 87.9
Platelets: 155
Platelets: 262
Platelets: 194
RBC: 2.65 — ABNORMAL LOW
RBC: 3.04 — ABNORMAL LOW
RBC: 3.25 — ABNORMAL LOW
RBC: 4.38
RBC: 3.67 — ABNORMAL LOW
RDW: 15.1
WBC: 6.9
WBC: 6.9
WBC: 8.9

## 2011-06-17 LAB — PROTIME-INR
INR: 1.5
INR: 2.3 — ABNORMAL HIGH
INR: 2.1 — ABNORMAL HIGH
Prothrombin Time: 13.8
Prothrombin Time: 18.7 — ABNORMAL HIGH
Prothrombin Time: 25.7 — ABNORMAL HIGH
Prothrombin Time: 24 — ABNORMAL HIGH

## 2011-06-17 LAB — URINE MICROSCOPIC-ADD ON

## 2011-06-17 LAB — URINALYSIS, ROUTINE W REFLEX MICROSCOPIC
Bilirubin Urine: NEGATIVE
Hgb urine dipstick: NEGATIVE
Ketones, ur: NEGATIVE
Protein, ur: NEGATIVE
Urobilinogen, UA: 0.2
pH: 6

## 2011-06-17 LAB — COMPREHENSIVE METABOLIC PANEL
ALT: 13
Albumin: 3.7
CO2: 29
Calcium: 9.8
GFR calc non Af Amer: 53 — ABNORMAL LOW
Potassium: 3.2 — ABNORMAL LOW

## 2011-06-17 LAB — DIFFERENTIAL
Basophils Absolute: 0
Eosinophils Absolute: 0.3
Lymphocytes Relative: 39
Monocytes Absolute: 0.3
Neutro Abs: 3.5

## 2011-06-17 LAB — URINE CULTURE: Colony Count: >=100000

## 2011-06-17 LAB — ABO/RH: ABO/RH(D): B POS

## 2011-06-18 LAB — BASIC METABOLIC PANEL
BUN: 6
CO2: 26
CO2: 26
Calcium: 9
Chloride: 108
Chloride: 113 — ABNORMAL HIGH
GFR calc Af Amer: >60
GFR calc non Af Amer: >60
Glucose, Bld: 102 — ABNORMAL HIGH
Glucose, Bld: 85
Potassium: 3.5
Potassium: 3.7
Sodium: 141
Sodium: 143

## 2011-06-18 LAB — RAPID URINE DRUG SCREEN, HOSP PERFORMED
Barbiturates: NOT DETECTED
Benzodiazepines: POSITIVE — AB

## 2011-06-18 LAB — COMPREHENSIVE METABOLIC PANEL
ALT: 9
AST: 13
Albumin: 2.7 — ABNORMAL LOW
Alkaline Phosphatase: 79
Alkaline Phosphatase: 91
BUN: 9
CO2: 26
Calcium: 8.6
Chloride: 107
GFR calc Af Amer: >60
GFR calc non Af Amer: 46 — ABNORMAL LOW
Glucose, Bld: 118 — ABNORMAL HIGH
Glucose, Bld: 90
Potassium: 3 — ABNORMAL LOW
Potassium: 3.1 — ABNORMAL LOW
Sodium: 144
Total Bilirubin: 0.4
Total Protein: 5.6 — ABNORMAL LOW
Total Protein: 6.1

## 2011-06-18 LAB — URINALYSIS, ROUTINE W REFLEX MICROSCOPIC
Glucose, UA: NEGATIVE
Protein, ur: NEGATIVE
pH: 6.5

## 2011-06-18 LAB — ETHANOL: Alcohol, Ethyl (B): <5

## 2011-06-18 LAB — CBC
HCT: 33 — ABNORMAL LOW
Hemoglobin: 10.4 — ABNORMAL LOW
Hemoglobin: 11.1 — ABNORMAL LOW
MCHC: 33.9
RDW: 15.1
RDW: 15.2

## 2011-06-18 LAB — DIFFERENTIAL
Basophils Absolute: 0.1
Basophils Relative: 1
Neutro Abs: 3.5
Neutrophils Relative %: 56

## 2011-06-18 LAB — SALICYLATE LEVEL: Salicylate Lvl: <4

## 2011-06-18 LAB — PROTIME-INR
INR: 2 — ABNORMAL HIGH
Prothrombin Time: 23.5 — ABNORMAL HIGH

## 2011-06-28 ENCOUNTER — Other Ambulatory Visit: Payer: Self-pay | Admitting: Internal Medicine

## 2011-06-28 DIAGNOSIS — R1031 Right lower quadrant pain: Secondary | ICD-10-CM

## 2011-07-01 ENCOUNTER — Ambulatory Visit
Admission: RE | Admit: 2011-07-01 | Discharge: 2011-07-01 | Disposition: A | Discharge status: Home / Self Care | Payer: Medicare HMO | Source: Ambulatory Visit | Attending: Internal Medicine | Admitting: Internal Medicine

## 2011-07-01 DIAGNOSIS — R1031 Right lower quadrant pain: Secondary | ICD-10-CM

## 2011-07-15 ENCOUNTER — Inpatient Hospital Stay (HOSPITAL_COMMUNITY)
Admission: EM | Admit: 2011-07-15 | Discharge: 2011-07-20 | DRG: 194 | Disposition: A | Discharge status: Home / Self Care | Payer: Medicare HMO | Attending: Internal Medicine | Admitting: Internal Medicine

## 2011-07-15 ENCOUNTER — Emergency Department (HOSPITAL_COMMUNITY): Payer: Medicare HMO

## 2011-07-15 DIAGNOSIS — Z96659 Presence of unspecified artificial knee joint: Secondary | ICD-10-CM

## 2011-07-15 DIAGNOSIS — Z79899 Other long term (current) drug therapy: Secondary | ICD-10-CM

## 2011-07-15 DIAGNOSIS — I10 Essential (primary) hypertension: Secondary | ICD-10-CM | POA: Diagnosis present

## 2011-07-15 DIAGNOSIS — J189 Pneumonia, unspecified organism: Principal | ICD-10-CM | POA: Diagnosis present

## 2011-07-15 DIAGNOSIS — E876 Hypokalemia: Secondary | ICD-10-CM | POA: Diagnosis present

## 2011-07-15 DIAGNOSIS — E669 Obesity, unspecified: Secondary | ICD-10-CM | POA: Diagnosis present

## 2011-07-15 DIAGNOSIS — R0902 Hypoxemia: Secondary | ICD-10-CM | POA: Diagnosis present

## 2011-07-15 DIAGNOSIS — K589 Irritable bowel syndrome without diarrhea: Secondary | ICD-10-CM | POA: Diagnosis present

## 2011-07-15 DIAGNOSIS — F329 Major depressive disorder, single episode, unspecified: Secondary | ICD-10-CM | POA: Diagnosis present

## 2011-07-15 DIAGNOSIS — F3289 Other specified depressive episodes: Secondary | ICD-10-CM | POA: Diagnosis present

## 2011-07-15 DIAGNOSIS — IMO0002 Reserved for concepts with insufficient information to code with codable children: Secondary | ICD-10-CM

## 2011-07-15 DIAGNOSIS — J45901 Unspecified asthma with (acute) exacerbation: Secondary | ICD-10-CM | POA: Diagnosis present

## 2011-07-15 LAB — POCT I-STAT, CHEM 8
BUN: 15 mg/dL (ref 6–23)
Chloride: 105 mEq/L (ref 96–112)
Glucose, Bld: 115 mg/dL — ABNORMAL HIGH (ref 70–99)
HCT: 38 % (ref 36.0–46.0)
Potassium: 3.2 mEq/L — ABNORMAL LOW (ref 3.5–5.1)

## 2011-07-15 LAB — CBC
HCT: 35.7 % — ABNORMAL LOW (ref 36.0–46.0)
Hemoglobin: 12 g/dL (ref 12.0–15.0)
RBC: 4.08 MIL/uL (ref 3.87–5.11)

## 2011-07-15 LAB — DIFFERENTIAL
Basophils Absolute: 0 10*3/uL (ref 0.0–0.1)
Basophils Relative: 1 % (ref 0–1)
Lymphocytes Relative: 55 % — ABNORMAL HIGH (ref 12–46)
Monocytes Relative: 6 % (ref 3–12)
Neutro Abs: 2.1 10*3/uL (ref 1.7–7.7)
Neutrophils Relative %: 35 % — ABNORMAL LOW (ref 43–77)

## 2011-07-15 LAB — POCT I-STAT TROPONIN I: Troponin i, poc: 0 ng/mL (ref 0.00–0.08)

## 2011-07-15 LAB — PRO B NATRIURETIC PEPTIDE: Pro B Natriuretic peptide (BNP): 34 pg/mL (ref 0–125)

## 2011-07-16 LAB — BASIC METABOLIC PANEL
BUN: 17 mg/dL (ref 6–23)
CO2: 24 mEq/L (ref 19–32)
Chloride: 107 mEq/L (ref 96–112)
Glucose, Bld: 184 mg/dL — ABNORMAL HIGH (ref 70–99)
Potassium: 4 mEq/L (ref 3.5–5.1)

## 2011-07-16 LAB — CBC
HCT: 33 % — ABNORMAL LOW (ref 36.0–46.0)
Hemoglobin: 11 g/dL — ABNORMAL LOW (ref 12.0–15.0)
WBC: 8.7 10*3/uL (ref 4.0–10.5)

## 2011-07-17 LAB — BASIC METABOLIC PANEL
BUN: 22 mg/dL (ref 6–23)
CO2: 24 mEq/L (ref 19–32)
Chloride: 106 mEq/L (ref 96–112)
GFR calc Af Amer: 59 mL/min — ABNORMAL LOW (ref >90)
Glucose, Bld: 167 mg/dL — ABNORMAL HIGH (ref 70–99)
Potassium: 3.9 mEq/L (ref 3.5–5.1)

## 2011-07-19 NOTE — H&P (Signed)
[name], [name]              ACCOUNT NO.:  [id number]  MEDICAL RECORD NO.:  [id number]  LOCATION:                                 FACILITY:  PHYSICIAN:  Baltazar Najjar, MD     DATE OF BIRTH:  03-Oct-1940  DATE OF ADMISSION:  07/15/2011 DATE OF DISCHARGE:                             HISTORY & PHYSICAL   PRIMARY CARE PHYSICIAN:  Renee Lance, MD with [facility] at [facility].  CODE STATUS:  She is full code.  CHIEF COMPLAINT:  Shortness of breath.  HISTORY OF PRESENT ILLNESS:  [name] is a 70 year old African American woman with history of asthma, presented to the ER today with chief complaint of shortness of breath for about a week.  It started with a runny nose and sore throat and progressively getting worse associated with coughing, which she described as productive of yellowish sputum.  She also stated that she felt warm, but she never checked her temperature.  She also stated that she had wheezing, on postnasal drip, and had difficulty lying flat on bed.  She denies any chest pain, however, stated that when she coughs really hard her chest, she has some chest discomfort.  In the ED, the patient was found to have wheezing and she was slightly hypoxemic.  She was placed on oxygen and chest x-ray showed evidence of possible left lower lobe pneumonia, and she was treated with nebulizer treatment, Avelox, and oxygen.  We were asked to admit the patient for further workup and management.  As per the patient, she was having some abdominal discomfort and constipation last Thursday.  She was seen by her PCP and CT scan of the abdomen was done and as per her was unremarkable.  She currently denies any abdominal pain.  Denies any vomiting.  She stated that she feels nauseated some time, otherwise denies any other change in her bowel habits or abdominal pain.  PAST MEDICAL HISTORY: 1. Asthma. 2. Hypertension. 3. History of depression. 4. History of irritable bowel  syndrome as per her was recently     diagnosed.  PAST SURGICAL HISTORY:  History of knee replacement.  SOCIAL HISTORY:  Lives alone.  Stated that she never smoked.  Denies ETOH or illicit drug use.  ALLERGIES:  No known drug allergy.  HOME MEDICATION:  Her medication reconciliation form is still pending at the time of dictation.  However, as per her, she takes lisinopril, potassium chloride, Singulair, and bupropion hydrochloride, clorazepate dipotassium.  She also takes Cymbalta.  REVIEW OF SYSTEMS:  As above in HPI.  Denies any chest pain, except when coughing.  Denies significant for shortness of breath and wheezing and cough as above.  She denies any dysuria.  She stated that she has frequent urination sometimes.  PHYSICAL EXAMINATION:  VITAL SIGNS:  Blood pressure 114/61, heart rate of 80, temperature 98.2, O2 saturation 96% on 2 L of oxygen. GENERAL:  She is alert, oriented x3, not in acute distress. NECK:  Supple.  No JVD. LUNGS:  Showed scattered wheezing and left lower lobe rales noted. ABDOMEN:  Obese, soft.  She has some discomfort on deep palpation. However, there was no rigidity, no rebound noted.  Bowel sounds heard normally. EXTREMITIES:  Showed no pedal edema. NEUROLOGIC:  She is alert and oriented x3.  No focal neurological deficit appreciated.  LABS:  ProBNP 34.  WBC 6, hemoglobin 12, platelet count 243.  Troponins 0.00.  Sodium 145, potassium 3.2, BUN 15, creatinine 1.3.  RADIOLOGY:  Chest x-ray showed mild left lower lobe opacity, raises concern for mild pneumonia.  Mild vascular congestion without definite pulmonary edema.  ASSESSMENT AND PLAN: 1. Community-acquired pneumonia. 2. Asthma exacerbation. 3. Hypokalemia. 4. Recent diagnosis with irritable bowel syndrome.  PLAN: 1. The patient will be admitted to the medical floor. 2. We will continue Avelox, nebulizer treatment, and oxygen. 3. We will replete potassium and check magnesium level. 4.  Medication is to be reconciled and verified by pharmacy. 5. Her creatinine is elevated at 1.3, I do not have any recent     baseline creatinine.  Her creatinine previously in 2009 was 0.7.  I     am not sure if she have acute versus chronic kidney disease.     However, I will hold off her lisinopril at this time.  Pending     further information from her PCP regarding her renal functions. 6. Message left for Dr. Kirby Funk to assume patient's care today.          ______________________________ Baltazar Najjar, MD     SA/MEDQ  D:  07/15/2011  T:  07/15/2011  Job:  [id number]  cc:   [name], M.D.  Electronically Signed by Hannah Beat MD on 07/19/2011 05:47:50 PM

## 2011-07-21 NOTE — Discharge Summary (Signed)
[name], [name]              ACCOUNT NO.:  [id number]  MEDICAL RECORD NO.:  [id number]  LOCATION:  4507                         FACILITY:  [facility]  PHYSICIAN:  Thora Lance, M.D.  DATE OF BIRTH:  11-24-1940  DATE OF ADMISSION:  07/15/2011 DATE OF DISCHARGE:  07/20/2011                              DISCHARGE SUMMARY   REASON FOR ADMISSION:  This is a 70 year old black female with history of asthma who presents to ER with shortness of breath about a week prior to admission.  She has started with a runny nose and sore throat and had progressively gotten worse with coughing and bringing up yellowish sputum.  She had wheezing and difficulty lying flat in bed.  She was slightly hypoxemic in the ER.  Chest x-ray showed left lower lobe pneumonia.  SIGNIFICANT FINDINGS:  VITAL SIGNS:  Blood pressure 114/61, heart rate 80, temperature 98.2, oxygen saturation is 96% on 2 L. LUNGS:  Showed scattered expiratory wheezing and left lower lobe rales. HEART:  Regular rate and rhythm.  ABDOMEN:  Some discomfort to palpation but no rigidity or rebound. EXTREMITIES:  No edema.  LABORATORY DATA:  Pro BNP 34.  WBC 6, hemoglobin 12, platelet count 243, troponin 0.  Sodium 145, potassium 3.2, BUN 15, creatinine 1.3.  Chest x- ray showed left lower lobe opacity concerning for pneumonia with mild vascular congestion.  HOSPITAL COURSE: 1. Asthma exacerbation.  She has pneumonia.  The patient was admitted     and placed on IV Solu-Medrol and IV Avelox.  She was given     nebulizers and oxygen.  She steadily got better through the     hospitalization with resolution of wheezing and shortness of     breath.  She was oxygenating well on room air at discharge.  She     was switched to p.o. Avelox and p.o. steroids and continued to do     well and was discharged in good condition. 2. Hypertension.  Her amlodipine was held and she was treated with     lisinopril and HCTZ.  Her blood pressure at home  is still running     relatively low as low as 115 systolic level and her amlodipine was     held at discharge. 3. Hypokalemia, this has been repleted p.o.  DISCHARGE DIAGNOSES: 1. Asthma exacerbation. 2. Community-acquired pneumonia. 3. Hypertension. 4. Hypokalemia. 5. History of depression. 6. Irritable bowel syndrome.  PROCEDURES:  None.  DISCHARGE MEDICATIONS: 1. Prednisone 60 mg a day for 1 day, 40 mg a day for 2 days, 20 mg a     day for 2 days, 10 mg a day for 2 days and then discontinue. 2. Avelox 400 mg once a day. 3. Guaifenesin 600 mg 1 twice a day. 4. Bupropion XL 150 mg q.a.m. 5. Clorazepate 3.75 mg 1 tablet twice a day as needed. 6. Cymbalta 60 mg once a day. 7. Hydrocodone/Tylenol 5/500 one q.6h p.r.n. 8. Lisinopril HCT 20/25 mg once a day. 9. Montelukast 10 mg once every morning. 10.Potassium chloride 20 mEq once a day.  DISPOSITION:  Discharged to home.  FOLLOWUP:  10 days with Dr. Valentina Lucks.  ACTIVITY:  As tolerated.  CODE STATUS:  Full status.  DIET:  Low-sodium diet.          ______________________________ Thora Lance, M.D.     JJG/MEDQ  D:  07/20/2011  T:  07/20/2011  Job:  [id number]  Electronically Signed by Kirby Funk M.D. on 07/21/2011 03:41:43 PM

## 2012-01-19 ENCOUNTER — Other Ambulatory Visit: Payer: Self-pay | Admitting: Internal Medicine

## 2012-01-19 ENCOUNTER — Ambulatory Visit
Admission: RE | Admit: 2012-01-19 | Discharge: 2012-01-19 | Disposition: A | Discharge status: Home / Self Care | Payer: Medicare HMO | Source: Ambulatory Visit | Attending: Internal Medicine | Admitting: Internal Medicine

## 2012-01-19 DIAGNOSIS — S069X9A Unspecified intracranial injury with loss of consciousness of unspecified duration, initial encounter: Secondary | ICD-10-CM

## 2013-02-16 ENCOUNTER — Emergency Department (HOSPITAL_COMMUNITY)
Admission: EM | Admit: 2013-02-16 | Discharge: 2013-02-17 | Disposition: A | Discharge status: Home / Self Care | Payer: Medicare Other | Attending: Emergency Medicine | Admitting: Emergency Medicine

## 2013-02-16 ENCOUNTER — Encounter (HOSPITAL_COMMUNITY): Payer: Self-pay | Admitting: Emergency Medicine

## 2013-02-16 DIAGNOSIS — K219 Gastro-esophageal reflux disease without esophagitis: Secondary | ICD-10-CM | POA: Insufficient documentation

## 2013-02-16 DIAGNOSIS — R112 Nausea with vomiting, unspecified: Secondary | ICD-10-CM | POA: Insufficient documentation

## 2013-02-16 DIAGNOSIS — Z79899 Other long term (current) drug therapy: Secondary | ICD-10-CM | POA: Insufficient documentation

## 2013-02-16 DIAGNOSIS — F329 Major depressive disorder, single episode, unspecified: Secondary | ICD-10-CM | POA: Insufficient documentation

## 2013-02-16 DIAGNOSIS — F3289 Other specified depressive episodes: Secondary | ICD-10-CM | POA: Insufficient documentation

## 2013-02-16 DIAGNOSIS — R350 Frequency of micturition: Secondary | ICD-10-CM | POA: Insufficient documentation

## 2013-02-16 DIAGNOSIS — R12 Heartburn: Secondary | ICD-10-CM | POA: Insufficient documentation

## 2013-02-16 DIAGNOSIS — J45909 Unspecified asthma, uncomplicated: Secondary | ICD-10-CM | POA: Insufficient documentation

## 2013-02-16 HISTORY — DX: Gastro-esophageal reflux disease without esophagitis: K21.9

## 2013-02-16 HISTORY — DX: Depression, unspecified: F32.A

## 2013-02-16 HISTORY — DX: Major depressive disorder, single episode, unspecified: F32.9

## 2013-02-16 HISTORY — DX: Unspecified asthma, uncomplicated: J45.909

## 2013-02-16 LAB — CBC WITH DIFFERENTIAL/PLATELET
Basophils Relative: 0 % (ref 0–1)
HCT: 36.2 % (ref 36.0–46.0)
Hemoglobin: 12.4 g/dL (ref 12.0–15.0)
MCH: 29 pg (ref 26.0–34.0)
MCHC: 34.3 g/dL (ref 30.0–36.0)
Monocytes Absolute: 0.3 10*3/uL (ref 0.1–1.0)
Monocytes Relative: 4 % (ref 3–12)
Neutro Abs: 4.3 10*3/uL (ref 1.7–7.7)

## 2013-02-16 LAB — COMPREHENSIVE METABOLIC PANEL
Albumin: 3.9 g/dL (ref 3.5–5.2)
BUN: 8 mg/dL (ref 6–23)
Chloride: 103 mEq/L (ref 96–112)
Creatinine, Ser: 0.92 mg/dL (ref 0.50–1.10)
GFR calc non Af Amer: 60 mL/min — ABNORMAL LOW (ref >90)
Total Bilirubin: 0.3 mg/dL (ref 0.3–1.2)

## 2013-02-16 LAB — LIPASE, BLOOD: Lipase: 17 U/L (ref 11–59)

## 2013-02-16 MED ORDER — SODIUM CHLORIDE 0.9 % IV BOLUS (SEPSIS)
1000.0000 mL | Freq: Once | INTRAVENOUS | Status: AC
  Administered 2013-02-16: 1000 mL via INTRAVENOUS

## 2013-02-16 MED ORDER — ONDANSETRON 4 MG PO TBDP
8.0000 mg | ORAL_TABLET | Freq: Once | ORAL | Status: AC
  Administered 2013-02-16: 8 mg via ORAL

## 2013-02-16 MED ORDER — ONDANSETRON 4 MG PO TBDP
ORAL_TABLET | ORAL | Status: AC
  Filled 2013-02-16: qty 2

## 2013-02-16 MED ORDER — SODIUM CHLORIDE 0.9 % IV SOLN: Freq: Once | INTRAVENOUS | Status: DC

## 2013-02-16 MED ORDER — ONDANSETRON HCL 4 MG/2ML IJ SOLN
4.0000 mg | Freq: Once | INTRAMUSCULAR | Status: AC
  Administered 2013-02-16: 4 mg via INTRAVENOUS
  Filled 2013-02-16: qty 2

## 2013-02-16 NOTE — ED Provider Notes (Addendum)
History     CSN: [id number]  Arrival date & time 02/16/13  4098   First MD Initiated Contact with Patient 02/16/13 2256      Chief Complaint  Patient presents with  . Nausea  . Emesis    (Consider location/radiation/quality/duration/timing/severity/associated sxs/prior treatment) HPI 72 year old female presents to emergency room with complaint of nausea and vomiting since waking this morning around 8 AM.  She has had multiple episodes of vomiting.  She denies any diarrhea.  No sick contacts, no unusual foods, travel, no well water.  No fevers no chills.  Patient had tooth pulled earlier this week, has been taking amoxicillin and 800 Motrin for same.  She reports some burning in her upper abdomen.  She denies any abdominal pain.  She reports she has had some increased urinary frequency, but no burning or pain.  Past Medical History  Diagnosis Date  . Depression   . Asthma   . GERD (gastroesophageal reflux disease)     Past Surgical History  Procedure Laterality Date  . Tooth extraction    . Abdominal hysterectomy    . Knee surgery      No family history on file.  History  Substance Use Topics  . Smoking status: Never Smoker   . Smokeless tobacco: Not on file  . Alcohol Use: No    OB History   Grav Para Term Preterm Abortions TAB SAB Ect Mult Living                  Review of Systems  All other systems reviewed and are negative.    Allergies  Aspirin  Home Medications   Current Outpatient Rx  Name  Route  Sig  Dispense  Refill  . amoxicillin (AMOXIL) 500 MG capsule   Oral   Take 500 mg by mouth 3 (three) times daily. For 10 days; Start date 02/13/13         . buPROPion (WELLBUTRIN XL) 150 MG 24 hr tablet   Oral   Take 150 mg by mouth every morning.         . DULoxetine (CYMBALTA) 60 MG capsule   Oral   Take 60 mg by mouth daily.         Marland Kitchen ibuprofen (ADVIL,MOTRIN) 800 MG tablet   Oral   Take 800 mg by mouth every 8 (eight) hours.          Marland Kitchen LORazepam (ATIVAN) 0.5 MG tablet   Oral   Take 0.5 mg by mouth 2 (two) times daily as needed for anxiety.         . pantoprazole (PROTONIX) 40 MG tablet   Oral   Take 40 mg by mouth daily.         . promethazine (PHENERGAN) 25 MG tablet   Oral   Take 25 mg by mouth daily as needed for nausea.         . traMADol (ULTRAM) 50 MG tablet   Oral   Take 50 mg by mouth every 6 (six) hours as needed for pain.           BP 140/111  Pulse 103  Temp(Src) 99.1 F (37.3 C) (Oral)  Resp 16  SpO2 95%  Physical Exam  Nursing note and vitals reviewed. Constitutional: She is oriented to person, place, and time. She appears well-developed and well-nourished.  HENT:  Head: Normocephalic and atraumatic.  Nose: Nose normal.  Mouth/Throat: Oropharynx is clear and moist.  Eyes: Conjunctivae and EOM are  normal. Pupils are equal, round, and reactive to light.  Neck: Normal range of motion. Neck supple. No JVD present. No tracheal deviation present. No thyromegaly present.  Cardiovascular: Normal rate, regular rhythm, normal heart sounds and intact distal pulses.  Exam reveals no gallop and no friction rub.   No murmur heard. Pulmonary/Chest: Effort normal and breath sounds normal. No stridor. No respiratory distress. She has no wheezes. She has no rales. She exhibits no tenderness.  Abdominal: Soft. Bowel sounds are normal. She exhibits no distension and no mass. There is no tenderness. There is no rebound and no guarding.  Musculoskeletal: Normal range of motion. She exhibits no edema and no tenderness.  Lymphadenopathy:    She has no cervical adenopathy.  Neurological: She is alert and oriented to person, place, and time. She exhibits normal muscle tone. Coordination normal.  Skin: Skin is warm and dry. No rash noted. No erythema. No pallor.  Psychiatric: She has a normal mood and affect. Her behavior is normal. Judgment and thought content normal.    ED Course  Procedures  (including critical care time)  Labs Reviewed  COMPREHENSIVE METABOLIC PANEL - Abnormal; Notable for the following:    Glucose, Bld 150 (*)    GFR calc non Af Amer 60 (*)    GFR calc Af Amer 70 (*)    All other components within normal limits  URINALYSIS, ROUTINE W REFLEX MICROSCOPIC - Abnormal; Notable for the following:    Protein, ur 30 (*)    All other components within normal limits  URINE MICROSCOPIC-ADD ON - Abnormal; Notable for the following:    Squamous Epithelial / LPF FEW (*)    Casts HYALINE CASTS (*)    All other components within normal limits  CBC WITH DIFFERENTIAL  LIPASE, BLOOD   No results found.   Date: 02/17/2013  Rate:95  Rhythm: normal sinus rhythm  QRS Axis: left  Intervals: PR prolonged  ST/T Wave abnormalities: normal  Conduction Disutrbances:first-degree A-V block   Narrative Interpretation:   Old EKG Reviewed: none available  1. Nausea and vomiting       MDM  72 year old female with one day of nausea and vomiting.  No fever no diarrhea.  Patient recently on 800 ibuprofen and amoxicillin for tooth pulled.  She also report reports increased urination.  Possible UTI.  We'll give fluids, antiemetics.  Labs are unremarkable.  Awaiting urine.   1:06 AM No signs of urinary tract infection.  Patient is wishing to eat and drink.  If she is able to tolerate by mouth, feel she is stable for discharge home.       Olivia Mackie, MD 02/17/13 1610  Olivia Mackie, MD 02/17/13 [phone]

## 2013-02-16 NOTE — ED Notes (Signed)
C/o nausea and vomited x 6 since 8am today.  Reports mid lower abd pain. Taking Motrin and Amoxicillin for 2 teeth extracted on Tuesday.

## 2013-02-16 NOTE — ED Notes (Signed)
Pt c/o emesis and abdominal cramping since eating breakfast this morning.  Says she has tried to eat Jello, rice, and peanut butter.  States that she has been unable to keep any food/drink down today.  Denies diarrhea.  States she is no longer nauseous after receiving Zofran in triage.  Denies dizziness/near-syncope.  Does c/o some discomfort in her upper chest since arriving in ED

## 2013-02-17 LAB — URINALYSIS, ROUTINE W REFLEX MICROSCOPIC
Bilirubin Urine: NEGATIVE
Ketones, ur: NEGATIVE mg/dL
Leukocytes, UA: NEGATIVE
Nitrite: NEGATIVE
Urobilinogen, UA: 0.2 mg/dL (ref 0.0–1.0)
pH: 6.5 (ref 5.0–8.0)

## 2013-02-17 LAB — URINE MICROSCOPIC-ADD ON

## 2013-02-17 MED ORDER — ONDANSETRON 4 MG PO TBDP: 4.0000 mg | ORAL_TABLET | Freq: Three times a day (TID) | ORAL | Status: DC | PRN

## 2013-02-17 MED ORDER — ONDANSETRON HCL 4 MG/2ML IJ SOLN
INTRAMUSCULAR | Status: AC
  Administered 2013-02-17: 4 mg
  Filled 2013-02-17: qty 2

## 2013-02-17 MED ORDER — FAMOTIDINE 20 MG PO TABS: 20.0000 mg | ORAL_TABLET | Freq: Two times a day (BID) | ORAL | Status: DC

## 2013-02-17 NOTE — ED Notes (Signed)
Rx x 2.  Pt voiced understanding to f/u with PCP and return for worsening condition.  

## 2013-09-20 DIAGNOSIS — Z9289 Personal history of other medical treatment: Secondary | ICD-10-CM

## 2013-09-20 HISTORY — DX: Personal history of other medical treatment: Z92.89

## 2014-03-11 ENCOUNTER — Other Ambulatory Visit: Payer: Self-pay | Admitting: Gastroenterology

## 2014-03-12 ENCOUNTER — Encounter (HOSPITAL_COMMUNITY): Payer: Self-pay | Admitting: Pharmacy Technician

## 2014-03-14 ENCOUNTER — Encounter (HOSPITAL_COMMUNITY): Payer: Self-pay | Admitting: *Deleted

## 2014-03-28 ENCOUNTER — Encounter (HOSPITAL_COMMUNITY)
Admission: RE | Disposition: A | Discharge status: Home / Self Care | Payer: Self-pay | Source: Ambulatory Visit | Attending: Gastroenterology

## 2014-03-28 ENCOUNTER — Ambulatory Visit (HOSPITAL_COMMUNITY): Payer: Medicare HMO | Admitting: Anesthesiology

## 2014-03-28 ENCOUNTER — Encounter (HOSPITAL_COMMUNITY): Payer: Medicare HMO | Admitting: Anesthesiology

## 2014-03-28 ENCOUNTER — Encounter (HOSPITAL_COMMUNITY): Payer: Self-pay | Admitting: *Deleted

## 2014-03-28 ENCOUNTER — Ambulatory Visit (HOSPITAL_COMMUNITY)
Admission: RE | Admit: 2014-03-28 | Discharge: 2014-03-28 | Disposition: A | Discharge status: Home / Self Care | Payer: Medicare HMO | Source: Ambulatory Visit | Attending: Gastroenterology | Admitting: Gastroenterology

## 2014-03-28 DIAGNOSIS — Z885 Allergy status to narcotic agent status: Secondary | ICD-10-CM | POA: Insufficient documentation

## 2014-03-28 DIAGNOSIS — N183 Chronic kidney disease, stage 3 unspecified: Secondary | ICD-10-CM | POA: Diagnosis not present

## 2014-03-28 DIAGNOSIS — Z1211 Encounter for screening for malignant neoplasm of colon: Secondary | ICD-10-CM | POA: Diagnosis present

## 2014-03-28 DIAGNOSIS — I129 Hypertensive chronic kidney disease with stage 1 through stage 4 chronic kidney disease, or unspecified chronic kidney disease: Secondary | ICD-10-CM | POA: Diagnosis not present

## 2014-03-28 DIAGNOSIS — K5730 Diverticulosis of large intestine without perforation or abscess without bleeding: Secondary | ICD-10-CM | POA: Insufficient documentation

## 2014-03-28 DIAGNOSIS — K219 Gastro-esophageal reflux disease without esophagitis: Secondary | ICD-10-CM | POA: Insufficient documentation

## 2014-03-28 DIAGNOSIS — J45909 Unspecified asthma, uncomplicated: Secondary | ICD-10-CM | POA: Diagnosis not present

## 2014-03-28 DIAGNOSIS — R1314 Dysphagia, pharyngoesophageal phase: Secondary | ICD-10-CM | POA: Diagnosis not present

## 2014-03-28 HISTORY — DX: Unspecified osteoarthritis, unspecified site: M19.90

## 2014-03-28 HISTORY — DX: Dysphagia, unspecified: R13.10

## 2014-03-28 HISTORY — DX: Essential (primary) hypertension: I10

## 2014-03-28 HISTORY — PX: COLONOSCOPY WITH PROPOFOL: SHX5780

## 2014-03-28 HISTORY — DX: Anemia, unspecified: D64.9

## 2014-03-28 HISTORY — PX: BALLOON DILATION: SHX5330

## 2014-03-28 HISTORY — DX: Personal history of other medical treatment: Z92.89

## 2014-03-28 HISTORY — PX: ESOPHAGOGASTRODUODENOSCOPY (EGD) WITH PROPOFOL: SHX5813

## 2014-03-28 SURGERY — ESOPHAGOGASTRODUODENOSCOPY (EGD) WITH PROPOFOL
Anesthesia: Monitor Anesthesia Care

## 2014-03-28 MED ORDER — BUTAMBEN-TETRACAINE-BENZOCAINE 2-2-14 % EX AERO
INHALATION_SPRAY | CUTANEOUS | Status: DC | PRN
  Administered 2014-03-28: 1 via TOPICAL

## 2014-03-28 MED ORDER — LACTATED RINGERS IV SOLN
INTRAVENOUS | Status: DC
Start: 2014-03-28 — End: 2014-03-28
  Administered 2014-03-28: 1000 mL via INTRAVENOUS

## 2014-03-28 MED ORDER — SODIUM CHLORIDE 0.9 % IV SOLN: INTRAVENOUS | Status: DC

## 2014-03-28 MED ORDER — PROPOFOL 10 MG/ML IV BOLUS
INTRAVENOUS | Status: DC | PRN
  Administered 2014-03-28: 50 mg via INTRAVENOUS
  Administered 2014-03-28: 30 mg via INTRAVENOUS
  Administered 2014-03-28: 20 mg via INTRAVENOUS

## 2014-03-28 MED ORDER — PROPOFOL INFUSION 10 MG/ML OPTIME
INTRAVENOUS | Status: DC | PRN
  Administered 2014-03-28: 140 ug/kg/min via INTRAVENOUS

## 2014-03-28 MED ORDER — PROPOFOL 10 MG/ML IV BOLUS
INTRAVENOUS | Status: AC
  Filled 2014-03-28: qty 20

## 2014-03-28 SURGICAL SUPPLY — 25 items

## 2014-03-28 NOTE — Anesthesia Preprocedure Evaluation (Signed)
Anesthesia Evaluation  Patient identified by MRN, date of birth, ID band Patient awake    Reviewed: Allergy & Precautions, H&P , NPO status , Patient's Chart, lab work & pertinent test results  Airway Mallampati: II TM Distance: >3 FB Neck ROM: Full    Dental no notable dental hx.    Pulmonary asthma ,  breath sounds clear to auscultation  Pulmonary exam normal       Cardiovascular hypertension, Rhythm:Regular Rate:Normal     Neuro/Psych negative neurological ROS  negative psych ROS   GI/Hepatic Neg liver ROS, GERD-  Medicated,  Endo/Other  negative endocrine ROS  Renal/GU negative Renal ROS  negative genitourinary   Musculoskeletal negative musculoskeletal ROS (+)   Abdominal   Peds negative pediatric ROS (+)  Hematology negative hematology ROS (+)   Anesthesia Other Findings   Reproductive/Obstetrics negative OB ROS                           Anesthesia Physical Anesthesia Plan  ASA: II  Anesthesia Plan: MAC   Post-op Pain Management:    Induction: Intravenous  Airway Management Planned: Nasal Cannula  Additional Equipment:   Intra-op Plan:   Post-operative Plan:   Informed Consent: I have reviewed the patients History and Physical, chart, labs and discussed the procedure including the risks, benefits and alternatives for the proposed anesthesia with the patient or authorized representative who has indicated his/her understanding and acceptance.   Dental advisory given  Plan Discussed with: CRNA and Surgeon  Anesthesia Plan Comments:         Anesthesia Quick Evaluation

## 2014-03-28 NOTE — H&P (Signed)
  Problems: Chronic solid food esophageal dysphagia. Perform repeat screening colonoscopy. Normal esophagogastroduodenoscopy and colonoscopy performed on 05/30/2003  History: The patient is a 73 year old female born [birth year]. She has chronic gastroesophageal reflux and underwent a normal diagnostic esophagogastroduodenoscopy and screening colonoscopy in September 2004.  The patient has chronic, intermittent solid food esophageal dysphagia localized to the mid retrosternal area. She reports no weight loss, nausea, or vomiting. She takes pantoprazole.  The patient is scheduled to undergo a diagnostic esophagogastroduodenoscopy with possible esophageal stricture dilation followed by a repeat screening colonoscopy today.  Past medical history: Hypertension. Moderate persistent asthma. Stage III chronic kidney disease. Allergic rhinitis. Chronic depression. Overactive bladder. Irritable bowel syndrome. Benign positional vertigo. Degenerative disc disease. Hypercholesterolemia. Herpes zoster infection in July 2003. Right ankle fracture. Right carpal tunnel release surgery. Right knee arthroscopy. Hysterectomy. Unilateral salpingo-oophorectomy. Eye surgery.  Allergies: Aspirin. Tussionex. Codeine.  Exam: The patient is alert and lying comfortably on the endoscopy stretcher. Lungs are clear to auscultation. Cardiac exam reveals a regular rhythm. Abdomen is soft and nontender to palpation.  Plan: Proceed with diagnostic esophagogastroduodenoscopy with possible esophageal stricture dilation followed by screening colonoscopy

## 2014-03-28 NOTE — Op Note (Signed)
Problems: Chronic, solid food esophageal dysphagia. Screening colonoscopy. Normal esophagogastroduodenoscopy and screening colonoscopy performed 05/30/2003  Endoscopist: Danise EdgeMartin Catia Todorov  Premedication: Propofol administered by anesthesia  Procedure: Diagnostic esophagogastroduodenoscopy The patient was placed in the left lateral decubitus position. The Pentax gastroscope was passed through the posterior hypopharynx into the proximal esophagus without difficulty. The hypopharynx, larynx, and vocal cords appeared normal.  Esophagoscopy: The proximal, mid, and lower segments of the esophageal mucosa appeared normal. The squamocolumnar junction was regular in appearance and located at 40 cm from the incisor teeth.  Gastroscopy: Retroflex view of the gastric cardia and fundus was normal. The gastric body, antrum, and pylorus appeared normal.  Duodenoscopy: The duodenal bulb and descending duodenum appeared normal  Assessment: Normal esophagogastroduodenoscopy  Recommendation: If esophageal dysphagia persists, schedule high-resolution esophageal manometry  Procedure: Screening colonoscopy. Anal inspection and digital rectal exam were normal. The Pentax pediatric colonoscope was introduced into the rectum and advanced to the cecum. A normal-appearing ileocecal valve and appendiceal orifice were identified. Colonic preparation for the exam today was fair in the right colon; after vigorous water lavage, the colonic preparation was good. The patient did have difficulty retaining the insufflated carbon dioxide making accurate examination of the left colon challenge  Rectum. Normal. Retroflexed view of the distal rectum normal  Sigmoid colon and descending colon.  Extensive left colonic diverticulosis  Splenic flexure. Normal  Transverse colon. Normal  Hepatic flexure. Normal  Ascending colon. Normal  Cecum and ileocecal valve. Normal  Assessment:  #1. Normal screening colonoscopy  #2.  Extensive left colonic diverticulosis

## 2014-03-28 NOTE — Anesthesia Postprocedure Evaluation (Signed)
  Anesthesia Post-op Note  Patient: [name]  Procedure(s) Performed: Procedure(s) (LRB): ESOPHAGOGASTRODUODENOSCOPY (EGD) WITH PROPOFOL (N/A) BALLOON DILATION (N/A) COLONOSCOPY WITH PROPOFOL (N/A)  Patient Location: PACU  Anesthesia Type: MAC  Level of Consciousness: awake and alert   Airway and Oxygen Therapy: Patient Spontanous Breathing  Post-op Pain: mild  Post-op Assessment: Post-op Vital signs reviewed, Patient's Cardiovascular Status Stable, Respiratory Function Stable, Patent Airway and No signs of Nausea or vomiting  Last Vitals:  Filed Vitals:   03/28/14 1410  BP: 161/90  Temp:   Resp: 11    Post-op Vital Signs: stable   Complications: No apparent anesthesia complications

## 2014-03-28 NOTE — Discharge Instructions (Signed)
Monitored Anesthesia Care  Monitored anesthesia care is an anesthesia service for a medical procedure. Anesthesia is the loss of the ability to feel pain. It is produced by medications called anesthetics. It may affect a small area of your body (local anesthesia), a large area of your body (regional anesthesia), or your entire body (general anesthesia). The need for monitored anesthesia care depends your procedure, your condition, and the potential need for regional or general anesthesia. It is often provided during procedures where:   General anesthesia may be needed if there are complications. This is because you need special care when you are under general anesthesia.   You will be under local or regional anesthesia. This is so that you are able to have higher levels of anesthesia if needed.   You will receive calming medications (sedatives). This is especially the case if sedatives are given to put you in a semi-conscious state of relaxation (deep sedation). This is because the amount of sedative needed to produce this state can be hard to predict. Too much of a sedative can produce general anesthesia. Monitored anesthesia care is performed by one or more caregivers who have special training in all types of anesthesia. You will need to meet with these caregivers before your procedure. During this meeting, they will ask you about your medical history. They will also give you instructions to follow. (For example, you will need to stop eating and drinking before your procedure. You may also need to stop or change medications you are taking.) During your procedure, your caregivers will stay with you. They will:   Watch your condition. This includes watching you blood pressure, breathing, and level of pain.   Diagnose and treat problems that occur.   Give medications if they are needed. These may include calming medications (sedatives) and anesthetics.   Make sure you are comfortable.  Having  monitored anesthesia care does not necessarily mean that you will be under anesthesia. It does mean that your caregivers will be able to manage anesthesia if you need it or if it occurs. It also means that you will be able to have a different type of anesthesia than you are having if you need it. When your procedure is complete, your caregivers will continue to watch your condition. They will make sure any medications wear off before you are allowed to go home.  Document Released: 06/02/2005 Document Revised: 01/01/2013 Document Reviewed: 10/18/2012 [facility] Patient Information 2015 [place], [place]. This information is not intended to replace advice given to you by your health care provider. Make sure you discuss any questions you have with your health care provider. Esophagogastroduodenoscopy Care After Refer to this sheet in the next few weeks. These instructions provide you with information on caring for yourself after your procedure. Your caregiver may also give you more specific instructions. Your treatment has been planned according to current medical practices, but problems sometimes occur. Call your caregiver if you have any problems or questions after your procedure.  HOME CARE INSTRUCTIONS  Do not eat or drink anything until the numbing medicine (local anesthetic) has worn off and your gag reflex has returned. You will know that the local anesthetic has worn off when you can swallow comfortably.  Do not drive for 12 hours after the procedure or as directed by your caregiver.  Only take medicines as directed by your caregiver. SEEK MEDICAL CARE IF:   You cannot stop coughing.  You are not urinating at all or less than usual. SEEK  IMMEDIATE MEDICAL CARE IF:  You have difficulty swallowing.  You cannot eat or drink.  You have worsening throat or chest pain.  You have dizziness, lightheadedness, or you faint.  You have nausea or vomiting.  You have chills.  You have a  fever.  You have severe abdominal pain.  You have black, tarry, or bloody stools. Document Released: 08/23/2012 Document Reviewed: 08/23/2012 [facility] Patient Information 2015 [place], [place]. This information is not intended to replace advice given to you by your health care provider. Make sure you discuss any questions you have with your health care provider.

## 2014-03-28 NOTE — Transfer of Care (Signed)
Immediate Anesthesia Transfer of Care Note  Patient: Lance Boschancy E Kiesel  Procedure(s) Performed: Procedure(s) (LRB): ESOPHAGOGASTRODUODENOSCOPY (EGD) WITH PROPOFOL (N/A) BALLOON DILATION (N/A) COLONOSCOPY WITH PROPOFOL (N/A)  Patient Location: PACU  Anesthesia Type: MAC  Level of Consciousness: sedated, patient cooperative and responds to stimulation  Airway & Oxygen Therapy: Patient Spontanous Breathing and Patient connected to face mask oxgen  Post-op Assessment: Report given to PACU RN and Post -op Vital signs reviewed and stable  Post vital signs: Reviewed and stable  Complications: No apparent anesthesia complications

## 2014-03-29 ENCOUNTER — Encounter (HOSPITAL_COMMUNITY): Payer: Self-pay | Admitting: Gastroenterology

## 2014-07-30 ENCOUNTER — Emergency Department (HOSPITAL_COMMUNITY)
Admission: EM | Admit: 2014-07-30 | Discharge: 2014-07-30 | Disposition: A | Discharge status: Home / Self Care | Payer: Medicare HMO | Attending: Emergency Medicine | Admitting: Emergency Medicine

## 2014-07-30 ENCOUNTER — Emergency Department (HOSPITAL_COMMUNITY): Payer: Medicare HMO

## 2014-07-30 ENCOUNTER — Encounter (HOSPITAL_COMMUNITY): Payer: Self-pay | Admitting: Emergency Medicine

## 2014-07-30 DIAGNOSIS — Z79899 Other long term (current) drug therapy: Secondary | ICD-10-CM | POA: Diagnosis not present

## 2014-07-30 DIAGNOSIS — R112 Nausea with vomiting, unspecified: Secondary | ICD-10-CM | POA: Insufficient documentation

## 2014-07-30 DIAGNOSIS — R059 Cough, unspecified: Secondary | ICD-10-CM

## 2014-07-30 DIAGNOSIS — I10 Essential (primary) hypertension: Secondary | ICD-10-CM | POA: Insufficient documentation

## 2014-07-30 DIAGNOSIS — F329 Major depressive disorder, single episode, unspecified: Secondary | ICD-10-CM | POA: Insufficient documentation

## 2014-07-30 DIAGNOSIS — R1111 Vomiting without nausea: Secondary | ICD-10-CM

## 2014-07-30 DIAGNOSIS — M1990 Unspecified osteoarthritis, unspecified site: Secondary | ICD-10-CM | POA: Diagnosis not present

## 2014-07-30 DIAGNOSIS — Z862 Personal history of diseases of the blood and blood-forming organs and certain disorders involving the immune mechanism: Secondary | ICD-10-CM | POA: Diagnosis not present

## 2014-07-30 DIAGNOSIS — J45909 Unspecified asthma, uncomplicated: Secondary | ICD-10-CM | POA: Diagnosis not present

## 2014-07-30 DIAGNOSIS — K219 Gastro-esophageal reflux disease without esophagitis: Secondary | ICD-10-CM | POA: Diagnosis not present

## 2014-07-30 DIAGNOSIS — R05 Cough: Secondary | ICD-10-CM

## 2014-07-30 LAB — CBC WITH DIFFERENTIAL/PLATELET
BASOS ABS: 0 10*3/uL (ref 0.0–0.1)
BASOS PCT: 0 % (ref 0–1)
EOS PCT: 0 % (ref 0–5)
Eosinophils Absolute: 0 10*3/uL (ref 0.0–0.7)
HEMATOCRIT: 35.1 % — AB (ref 36.0–46.0)
Hemoglobin: 12.1 g/dL (ref 12.0–15.0)
Lymphocytes Relative: 18 % (ref 12–46)
Lymphs Abs: 1.3 10*3/uL (ref 0.7–4.0)
MCH: 30 pg (ref 26.0–34.0)
MCHC: 34.5 g/dL (ref 30.0–36.0)
MCV: 87.1 fL (ref 78.0–100.0)
MONO ABS: 0.3 10*3/uL (ref 0.1–1.0)
Monocytes Relative: 4 % (ref 3–12)
NEUTROS ABS: 5.5 10*3/uL (ref 1.7–7.7)
Neutrophils Relative %: 78 % — ABNORMAL HIGH (ref 43–77)
Platelets: 229 10*3/uL (ref 150–400)
RBC: 4.03 MIL/uL (ref 3.87–5.11)
RDW: 14.3 % (ref 11.5–15.5)
WBC: 7.1 10*3/uL (ref 4.0–10.5)

## 2014-07-30 LAB — URINALYSIS, ROUTINE W REFLEX MICROSCOPIC
Bilirubin Urine: NEGATIVE
Glucose, UA: NEGATIVE mg/dL
Ketones, ur: NEGATIVE mg/dL
LEUKOCYTES UA: NEGATIVE
Nitrite: NEGATIVE
PH: 7 (ref 5.0–8.0)
PROTEIN: NEGATIVE mg/dL
Specific Gravity, Urine: 1.014 (ref 1.005–1.030)
Urobilinogen, UA: 0.2 mg/dL (ref 0.0–1.0)

## 2014-07-30 LAB — URINE MICROSCOPIC-ADD ON

## 2014-07-30 LAB — COMPREHENSIVE METABOLIC PANEL
ALBUMIN: 3.8 g/dL (ref 3.5–5.2)
ALT: 13 U/L (ref 0–35)
AST: 21 U/L (ref 0–37)
Alkaline Phosphatase: 85 U/L (ref 39–117)
Anion gap: 16 — ABNORMAL HIGH (ref 5–15)
BUN: 13 mg/dL (ref 6–23)
CALCIUM: 9.7 mg/dL (ref 8.4–10.5)
CO2: 26 mEq/L (ref 19–32)
CREATININE: 1.06 mg/dL (ref 0.50–1.10)
Chloride: 104 mEq/L (ref 96–112)
GFR calc Af Amer: 58 mL/min — ABNORMAL LOW (ref >90)
GFR calc non Af Amer: 50 mL/min — ABNORMAL LOW (ref >90)
Glucose, Bld: 85 mg/dL (ref 70–99)
Potassium: 3.3 mEq/L — ABNORMAL LOW (ref 3.7–5.3)
Sodium: 146 mEq/L (ref 137–147)
TOTAL PROTEIN: 7.2 g/dL (ref 6.0–8.3)
Total Bilirubin: 0.5 mg/dL (ref 0.3–1.2)

## 2014-07-30 LAB — LIPASE, BLOOD: LIPASE: 11 U/L (ref 11–59)

## 2014-07-30 MED ORDER — POTASSIUM CHLORIDE CRYS ER 20 MEQ PO TBCR
40.0000 meq | EXTENDED_RELEASE_TABLET | Freq: Once | ORAL | Status: AC
  Administered 2014-07-30: 40 meq via ORAL
  Filled 2014-07-30: qty 2

## 2014-07-30 MED ORDER — ONDANSETRON 4 MG PO TBDP: 4.0000 mg | ORAL_TABLET | Freq: Three times a day (TID) | ORAL | Status: DC | PRN

## 2014-07-30 MED ORDER — ONDANSETRON HCL 4 MG/2ML IJ SOLN
4.0000 mg | Freq: Once | INTRAMUSCULAR | Status: AC
  Administered 2014-07-30: 4 mg via INTRAVENOUS
  Filled 2014-07-30: qty 2

## 2014-07-30 MED ORDER — SODIUM CHLORIDE 0.9 % IV SOLN
Freq: Once | INTRAVENOUS | Status: AC
  Administered 2014-07-30: 12:00:00 via INTRAVENOUS

## 2014-07-30 NOTE — ED Provider Notes (Signed)
CSN: [id number]     Arrival date & time 07/30/14  1039 History   First MD Initiated Contact with Patient 07/30/14 1050     Chief Complaint  Patient presents with  . Emesis     (Consider location/radiation/quality/duration/timing/severity/associated sxs/prior Treatment) Patient is a 73 y.o. female presenting with vomiting. The history is provided by the patient. No language interpreter was used.  Emesis Severity:  Severe Duration:  2 days Timing:  Constant Number of daily episodes:  Multiple Able to tolerate:  Liquids Progression:  Worsening Chronicity:  New Recent urination:  Normal Context: not post-tussive   Relieved by:  Nothing Worsened by:  Nothing tried Ineffective treatments:  None tried Associated symptoms: abdominal pain   Associated symptoms: no diarrhea, no fever and no headaches   Risk factors: no sick contacts     Past Medical History  Diagnosis Date  . Depression   . Asthma   . GERD (gastroesophageal reflux disease)   . Hypertension     off all bp meds for last year  . Dysphagia     trouble swallowing since april 2015  . Arthritis   . Anemia   . History of blood transfusion 2015   Past Surgical History  Procedure Laterality Date  . Tooth extraction    . Joint replacement Left 2010    knee  . Abdominal hysterectomy  age 73  . Eye surgery  age 73    for cross eyed  . Esophagogastroduodenoscopy (egd) with propofol N/A 03/28/2014    Procedure: ESOPHAGOGASTRODUODENOSCOPY (EGD) WITH PROPOFOL;  Surgeon: Charolett BumpersMartin K Johnson, MD;  Location: [facility];  Service: Endoscopy;  Laterality: N/A;  . Balloon dilation N/A 03/28/2014    Procedure: BALLOON DILATION;  Surgeon: Charolett BumpersMartin K Johnson, MD;  Location: [facility];  Service: Endoscopy;  Laterality: N/A;  . Colonoscopy with propofol N/A 03/28/2014    Procedure: COLONOSCOPY WITH PROPOFOL;  Surgeon: Charolett BumpersMartin K Johnson, MD;  Location: [facility];  Service: Endoscopy;  Laterality: N/A;   No family history on  file. History  Substance Use Topics  . Smoking status: Never Smoker   . Smokeless tobacco: Never Used  . Alcohol Use: No   OB History    No data available     Review of Systems  Gastrointestinal: Positive for nausea, vomiting and abdominal pain. Negative for diarrhea.  Neurological: Negative for headaches.  All other systems reviewed and are negative.     Allergies  Aspirin and Codeine  Home Medications   Prior to Admission medications   Medication Sig Start Date End Date Taking? Authorizing Provider  buPROPion (WELLBUTRIN XL) 300 MG 24 hr tablet Take 300 mg by mouth daily. 07/24/14  Yes Historical Provider, MD  DULoxetine (CYMBALTA) 60 MG capsule Take 60 mg by mouth daily.   Yes Historical Provider, MD  famotidine (PEPCID) 20 MG tablet Take 1 tablet (20 mg total) by mouth 2 (two) times daily. 02/17/13  Yes Olivia Mackielga M Otter, MD  LORazepam (ATIVAN) 0.5 MG tablet Take 0.5 mg by mouth 2 (two) times daily as needed for anxiety.   Yes Historical Provider, MD  montelukast (SINGULAIR) 10 MG tablet Take 10 mg by mouth at bedtime.   Yes Historical Provider, MD  pantoprazole (PROTONIX) 40 MG tablet Take 40 mg by mouth daily.   Yes Historical Provider, MD  PROAIR HFA 108 (90 BASE) MCG/ACT inhaler Inhale 2 puffs into the lungs every 4 (four) hours as needed. 07/24/14  Yes Historical Provider, MD  temazepam (RESTORIL) 15 MG  capsule Take 15 mg by mouth daily. 07/24/14  Yes Historical Provider, MD  traMADol (ULTRAM) 50 MG tablet Take 50 mg by mouth every 6 (six) hours as needed for pain.   Yes Historical Provider, MD  Albuterol Sulfate (PROAIR HFA IN) Inhale 2 puffs into the lungs every 6 (six) hours as needed (shortness of breath).     Historical Provider, MD  temazepam (RESTORIL) 30 MG capsule Take 30 mg by mouth at bedtime as needed for sleep.    Historical Provider, MD   BP 149/78 mmHg  Pulse 86  Temp(Src) 99 F (37.2 C) (Oral)  Resp 13  Ht 5\' 2"  (1.575 m)  Wt 180 lb (81.647 kg)  BMI 32.91  kg/m2  SpO2 94% Physical Exam  Constitutional: She appears well-developed and well-nourished.  HENT:  Head: Normocephalic and atraumatic.  Eyes: Conjunctivae are normal.  Neck: Normal range of motion. Neck supple.  Cardiovascular: Normal rate.   Pulmonary/Chest: Effort normal.  Abdominal: Soft. Bowel sounds are normal. There is no tenderness. There is no rebound and no guarding.  Musculoskeletal: Normal range of motion.  Neurological: She is alert.  Skin: There is erythema.    ED Course  Procedures (including critical care time) Labs Review Labs Reviewed  CBC WITH DIFFERENTIAL - Abnormal; Notable for the following:    HCT 35.1 (*)    Neutrophils Relative % 78 (*)    All other components within normal limits  COMPREHENSIVE METABOLIC PANEL - Abnormal; Notable for the following:    Potassium 3.3 (*)    GFR calc non Af Amer 50 (*)    GFR calc Af Amer 58 (*)    Anion gap 16 (*)    All other components within normal limits  URINALYSIS, ROUTINE W REFLEX MICROSCOPIC - Abnormal; Notable for the following:    Hgb urine dipstick TRACE (*)    All other components within normal limits  URINE MICROSCOPIC-ADD ON - Abnormal; Notable for the following:    Squamous Epithelial / LPF FEW (*)    All other components within normal limits  LIPASE, BLOOD   Results for orders placed or performed during the hospital encounter of 07/30/14  CBC with Differential  Result Value Ref Range   WBC 7.1 4.0 - 10.5 K/uL   RBC 4.03 3.87 - 5.11 MIL/uL   Hemoglobin 12.1 12.0 - 15.0 g/dL   HCT 16.1 (L) 09.6 - 04.5 %   MCV 87.1 78.0 - 100.0 fL   MCH 30.0 26.0 - 34.0 pg   MCHC 34.5 30.0 - 36.0 g/dL   RDW 40.9 81.1 - 91.4 %   Platelets 229 150 - 400 K/uL   Neutrophils Relative % 78 (H) 43 - 77 %   Neutro Abs 5.5 1.7 - 7.7 K/uL   Lymphocytes Relative 18 12 - 46 %   Lymphs Abs 1.3 0.7 - 4.0 K/uL   Monocytes Relative 4 3 - 12 %   Monocytes Absolute 0.3 0.1 - 1.0 K/uL   Eosinophils Relative 0 0 - 5 %    Eosinophils Absolute 0.0 0.0 - 0.7 K/uL   Basophils Relative 0 0 - 1 %   Basophils Absolute 0.0 0.0 - 0.1 K/uL  Comprehensive metabolic panel  Result Value Ref Range   Sodium 146 137 - 147 mEq/L   Potassium 3.3 (L) 3.7 - 5.3 mEq/L   Chloride 104 96 - 112 mEq/L   CO2 26 19 - 32 mEq/L   Glucose, Bld 85 70 - 99 mg/dL  BUN 13 6 - 23 mg/dL   Creatinine, Ser 8.291.06 0.50 - 1.10 mg/dL   Calcium 9.7 8.4 - 56.210.5 mg/dL   Total Protein 7.2 6.0 - 8.3 g/dL   Albumin 3.8 3.5 - 5.2 g/dL   AST 21 0 - 37 U/L   ALT 13 0 - 35 U/L   Alkaline Phosphatase 85 39 - 117 U/L   Total Bilirubin 0.5 0.3 - 1.2 mg/dL   GFR calc non Af Amer 50 (L) >90 mL/min   GFR calc Af Amer 58 (L) >90 mL/min   Anion gap 16 (H) 5 - 15  Urinalysis, Routine w reflex microscopic  Result Value Ref Range   Color, Urine YELLOW YELLOW   APPearance CLEAR CLEAR   Specific Gravity, Urine 1.014 1.005 - 1.030   pH 7.0 5.0 - 8.0   Glucose, UA NEGATIVE NEGATIVE mg/dL   Hgb urine dipstick TRACE (A) NEGATIVE   Bilirubin Urine NEGATIVE NEGATIVE   Ketones, ur NEGATIVE NEGATIVE mg/dL   Protein, ur NEGATIVE NEGATIVE mg/dL   Urobilinogen, UA 0.2 0.0 - 1.0 mg/dL   Nitrite NEGATIVE NEGATIVE   Leukocytes, UA NEGATIVE NEGATIVE  Lipase, blood  Result Value Ref Range   Lipase 11 11 - 59 U/L  Urine microscopic-add on  Result Value Ref Range   Squamous Epithelial / LPF FEW (A) RARE   WBC, UA 0-2 <3 WBC/hpf   RBC / HPF 0-2 <3 RBC/hpf   Bacteria, UA RARE RARE   Dg Chest 2 View  07/30/2014   CLINICAL DATA:  Three-day history of cough  EXAM: CHEST  2 VIEW  COMPARISON:  July 15, 2011  FINDINGS: There is no edema or consolidation. The heart size is upper normal with pulmonary vascularity within normal limits. No adenopathy. There is atherosclerotic change in the aorta. No bone lesions.  IMPRESSION: No edema or consolidation.   Electronically Signed   By: Bretta BangWilliam  Woodruff M.D.   On: 07/30/2014 11:44    Imaging Review Dg Chest 2  View  07/30/2014   CLINICAL DATA:  Three-day history of cough  EXAM: CHEST  2 VIEW  COMPARISON:  July 15, 2011  FINDINGS: There is no edema or consolidation. The heart size is upper normal with pulmonary vascularity within normal limits. No adenopathy. There is atherosclerotic change in the aorta. No bone lesions.  IMPRESSION: No edema or consolidation.   Electronically Signed   By: Bretta BangWilliam  Woodruff M.D.   On: 07/30/2014 11:44     EKG Interpretation None      MDM Pt reports she vomits after coughing.  Pt had abdominal pain while vomitting and during coughing.  Pt reports no current pain.   Pt is able to eat and drink without difficulty.      Final diagnoses:  Cough  Vomiting without nausea, vomiting of unspecified type    Pt given rx for zofran.   I advised to see Dr. Valentina LucksGriffin for recheck tomorrow.   Return if any problems.    Lonia SkinnerLeslie K RockwellSofia, PA-C 07/30/14 1457  Richardean Canalavid H Yao, MD 07/30/14 [phone]

## 2014-07-30 NOTE — ED Notes (Signed)
Multiple episodes of vomiting since yesterday at 1900.  Patient states she has vomited over 10 times since that time.  Has been seen for this before, this time last year.  Has intermittent abdominal pain, lower abdomen/pelvis.  Denies difficulty urinating.  States emesis is dark green

## 2014-07-30 NOTE — ED Notes (Signed)
Gave patient apple sauce. 

## 2014-07-30 NOTE — ED Notes (Signed)
Pt returned from xray

## 2015-05-11 ENCOUNTER — Encounter (HOSPITAL_COMMUNITY): Payer: Self-pay | Admitting: Emergency Medicine

## 2015-05-11 ENCOUNTER — Inpatient Hospital Stay (HOSPITAL_COMMUNITY)
Admission: EM | Admit: 2015-05-11 | Discharge: 2015-05-16 | DRG: 189 | Disposition: A | Discharge status: Home / Self Care | Payer: Medicare HMO | Attending: Internal Medicine | Admitting: Internal Medicine

## 2015-05-11 ENCOUNTER — Emergency Department (HOSPITAL_COMMUNITY): Payer: Medicare HMO

## 2015-05-11 DIAGNOSIS — J069 Acute upper respiratory infection, unspecified: Secondary | ICD-10-CM | POA: Diagnosis present

## 2015-05-11 DIAGNOSIS — K219 Gastro-esophageal reflux disease without esophagitis: Secondary | ICD-10-CM | POA: Diagnosis not present

## 2015-05-11 DIAGNOSIS — Z96652 Presence of left artificial knee joint: Secondary | ICD-10-CM | POA: Diagnosis present

## 2015-05-11 DIAGNOSIS — D649 Anemia, unspecified: Secondary | ICD-10-CM

## 2015-05-11 DIAGNOSIS — IMO0001 Reserved for inherently not codable concepts without codable children: Secondary | ICD-10-CM

## 2015-05-11 DIAGNOSIS — J4552 Severe persistent asthma with status asthmaticus: Secondary | ICD-10-CM

## 2015-05-11 DIAGNOSIS — F419 Anxiety disorder, unspecified: Secondary | ICD-10-CM | POA: Diagnosis not present

## 2015-05-11 DIAGNOSIS — F329 Major depressive disorder, single episode, unspecified: Secondary | ICD-10-CM | POA: Diagnosis not present

## 2015-05-11 DIAGNOSIS — J45902 Unspecified asthma with status asthmaticus: Secondary | ICD-10-CM | POA: Diagnosis not present

## 2015-05-11 DIAGNOSIS — J45901 Unspecified asthma with (acute) exacerbation: Secondary | ICD-10-CM | POA: Diagnosis present

## 2015-05-11 DIAGNOSIS — R1314 Dysphagia, pharyngoesophageal phase: Secondary | ICD-10-CM

## 2015-05-11 DIAGNOSIS — N179 Acute kidney failure, unspecified: Secondary | ICD-10-CM | POA: Diagnosis not present

## 2015-05-11 DIAGNOSIS — N183 Chronic kidney disease, stage 3 unspecified: Secondary | ICD-10-CM | POA: Diagnosis present

## 2015-05-11 DIAGNOSIS — R05 Cough: Secondary | ICD-10-CM | POA: Diagnosis not present

## 2015-05-11 DIAGNOSIS — J9601 Acute respiratory failure with hypoxia: Secondary | ICD-10-CM | POA: Diagnosis not present

## 2015-05-11 DIAGNOSIS — J00 Acute nasopharyngitis [common cold]: Secondary | ICD-10-CM | POA: Diagnosis not present

## 2015-05-11 DIAGNOSIS — J9600 Acute respiratory failure, unspecified whether with hypoxia or hypercapnia: Secondary | ICD-10-CM | POA: Diagnosis present

## 2015-05-11 DIAGNOSIS — R030 Elevated blood-pressure reading, without diagnosis of hypertension: Secondary | ICD-10-CM

## 2015-05-11 DIAGNOSIS — D631 Anemia in chronic kidney disease: Secondary | ICD-10-CM | POA: Diagnosis present

## 2015-05-11 DIAGNOSIS — T380X5A Adverse effect of glucocorticoids and synthetic analogues, initial encounter: Secondary | ICD-10-CM | POA: Diagnosis not present

## 2015-05-11 DIAGNOSIS — I129 Hypertensive chronic kidney disease with stage 1 through stage 4 chronic kidney disease, or unspecified chronic kidney disease: Secondary | ICD-10-CM | POA: Diagnosis not present

## 2015-05-11 DIAGNOSIS — J4532 Mild persistent asthma with status asthmaticus: Secondary | ICD-10-CM | POA: Diagnosis not present

## 2015-05-11 LAB — CBC WITH DIFFERENTIAL/PLATELET
BASOS ABS: 0.1 10*3/uL (ref 0.0–0.1)
BASOS PCT: 1 % (ref 0–1)
EOS ABS: 0.8 10*3/uL — AB (ref 0.0–0.7)
Eosinophils Relative: 12 % — ABNORMAL HIGH (ref 0–5)
HCT: 35.2 % — ABNORMAL LOW (ref 36.0–46.0)
HEMOGLOBIN: 11.7 g/dL — AB (ref 12.0–15.0)
LYMPHS ABS: 2 10*3/uL (ref 0.7–4.0)
Lymphocytes Relative: 29 % (ref 12–46)
MCH: 29.1 pg (ref 26.0–34.0)
MCHC: 33.2 g/dL (ref 30.0–36.0)
MCV: 87.6 fL (ref 78.0–100.0)
Monocytes Absolute: 0.4 10*3/uL (ref 0.1–1.0)
Monocytes Relative: 5 % (ref 3–12)
NEUTROS PCT: 53 % (ref 43–77)
Neutro Abs: 3.6 10*3/uL (ref 1.7–7.7)
Platelets: 239 10*3/uL (ref 150–400)
RBC: 4.02 MIL/uL (ref 3.87–5.11)
RDW: 14.5 % (ref 11.5–15.5)
WBC: 6.8 10*3/uL (ref 4.0–10.5)

## 2015-05-11 LAB — BASIC METABOLIC PANEL WITH GFR
Anion gap: 7 (ref 5–15)
BUN: 14 mg/dL (ref 6–20)
CO2: 27 mmol/L (ref 22–32)
Calcium: 8.8 mg/dL — ABNORMAL LOW (ref 8.9–10.3)
Chloride: 110 mmol/L (ref 101–111)
Creatinine, Ser: 1.18 mg/dL — ABNORMAL HIGH (ref 0.44–1.00)
GFR calc Af Amer: 51 mL/min — ABNORMAL LOW (ref >60)
GFR calc non Af Amer: 44 mL/min — ABNORMAL LOW (ref >60)
Glucose, Bld: 101 mg/dL — ABNORMAL HIGH (ref 65–99)
Potassium: 3.5 mmol/L (ref 3.5–5.1)
Sodium: 144 mmol/L (ref 135–145)

## 2015-05-11 LAB — I-STAT ARTERIAL BLOOD GAS, ED
Acid-base deficit: 2 mmol/L (ref 0.0–2.0)
Bicarbonate: 23.8 mEq/L (ref 20.0–24.0)
O2 Saturation: 94 %
PCO2 ART: 43.2 mmHg (ref 35.0–45.0)
PH ART: 7.348 — AB (ref 7.350–7.450)
PO2 ART: 77 mmHg — AB (ref 80.0–100.0)
TCO2: 25 mmol/L (ref 0–100)

## 2015-05-11 LAB — MRSA PCR SCREENING: MRSA BY PCR: NEGATIVE

## 2015-05-11 LAB — BRAIN NATRIURETIC PEPTIDE: B NATRIURETIC PEPTIDE 5: 14 pg/mL (ref 0.0–100.0)

## 2015-05-11 MED ORDER — ALBUTEROL SULFATE (2.5 MG/3ML) 0.083% IN NEBU
5.0000 mg | INHALATION_SOLUTION | Freq: Once | RESPIRATORY_TRACT | Status: AC
  Administered 2015-05-11: 5 mg via RESPIRATORY_TRACT
  Filled 2015-05-11: qty 6

## 2015-05-11 MED ORDER — TRAMADOL HCL 50 MG PO TABS
50.0000 mg | ORAL_TABLET | Freq: Four times a day (QID) | ORAL | Status: DC | PRN
  Administered 2015-05-11 – 2015-05-16 (×6): 50 mg via ORAL
  Filled 2015-05-11 (×7): qty 1

## 2015-05-11 MED ORDER — ALBUTEROL (5 MG/ML) CONTINUOUS INHALATION SOLN
15.0000 mg/h | INHALATION_SOLUTION | Freq: Once | RESPIRATORY_TRACT | Status: AC
  Administered 2015-05-11: 15 mg/h via RESPIRATORY_TRACT
  Filled 2015-05-11: qty 20

## 2015-05-11 MED ORDER — METHYLPREDNISOLONE SODIUM SUCC 125 MG IJ SOLR
125.0000 mg | Freq: Once | INTRAMUSCULAR | Status: AC
  Administered 2015-05-11: 125 mg via INTRAVENOUS
  Filled 2015-05-11: qty 2

## 2015-05-11 MED ORDER — METHYLPREDNISOLONE SODIUM SUCC 125 MG IJ SOLR
60.0000 mg | Freq: Four times a day (QID) | INTRAMUSCULAR | Status: DC
  Administered 2015-05-11 – 2015-05-13 (×7): 60 mg via INTRAVENOUS
  Filled 2015-05-11 (×9): qty 2

## 2015-05-11 MED ORDER — RACEPINEPHRINE HCL 2.25 % IN NEBU
0.5000 mL | INHALATION_SOLUTION | Freq: Once | RESPIRATORY_TRACT | Status: AC
Start: 2015-05-11 — End: 2015-05-11
  Administered 2015-05-11: 0.5 mL via RESPIRATORY_TRACT
  Filled 2015-05-11: qty 0.5

## 2015-05-11 MED ORDER — ALBUTEROL SULFATE (2.5 MG/3ML) 0.083% IN NEBU
2.5000 mg | INHALATION_SOLUTION | Freq: Four times a day (QID) | RESPIRATORY_TRACT | Status: DC
  Administered 2015-05-11 – 2015-05-12 (×3): 2.5 mg via RESPIRATORY_TRACT
  Filled 2015-05-11 (×3): qty 3

## 2015-05-11 MED ORDER — DULOXETINE HCL 60 MG PO CPEP
60.0000 mg | ORAL_CAPSULE | Freq: Every day | ORAL | Status: DC
  Administered 2015-05-11 – 2015-05-16 (×6): 60 mg via ORAL
  Filled 2015-05-11 (×6): qty 1

## 2015-05-11 MED ORDER — IPRATROPIUM BROMIDE 0.02 % IN SOLN
0.5000 mg | Freq: Once | RESPIRATORY_TRACT | Status: AC
  Administered 2015-05-11: 0.5 mg via RESPIRATORY_TRACT
  Filled 2015-05-11: qty 2.5

## 2015-05-11 MED ORDER — ONDANSETRON HCL 4 MG/2ML IJ SOLN: 4.0000 mg | Freq: Four times a day (QID) | INTRAMUSCULAR | Status: DC | PRN

## 2015-05-11 MED ORDER — BUPROPION HCL ER (XL) 300 MG PO TB24
300.0000 mg | ORAL_TABLET | Freq: Every day | ORAL | Status: DC
  Administered 2015-05-11 – 2015-05-16 (×6): 300 mg via ORAL
  Filled 2015-05-11 (×6): qty 1

## 2015-05-11 MED ORDER — ALUM & MAG HYDROXIDE-SIMETH 200-200-20 MG/5ML PO SUSP
30.0000 mL | Freq: Four times a day (QID) | ORAL | Status: DC | PRN
  Administered 2015-05-15: 30 mL via ORAL
  Filled 2015-05-11: qty 30

## 2015-05-11 MED ORDER — ACETAMINOPHEN 325 MG PO TABS
650.0000 mg | ORAL_TABLET | Freq: Four times a day (QID) | ORAL | Status: DC | PRN
  Administered 2015-05-11 – 2015-05-15 (×6): 650 mg via ORAL
  Filled 2015-05-11 (×6): qty 2

## 2015-05-11 MED ORDER — ENOXAPARIN SODIUM 40 MG/0.4ML ~~LOC~~ SOLN
40.0000 mg | SUBCUTANEOUS | Status: DC
  Administered 2015-05-11 – 2015-05-15 (×5): 40 mg via SUBCUTANEOUS
  Filled 2015-05-11 (×5): qty 0.4

## 2015-05-11 MED ORDER — SODIUM CHLORIDE 0.9 % IV SOLN
INTRAVENOUS | Status: DC
  Administered 2015-05-11: 14:00:00 via INTRAVENOUS

## 2015-05-11 MED ORDER — ONDANSETRON HCL 4 MG PO TABS: 4.0000 mg | ORAL_TABLET | Freq: Four times a day (QID) | ORAL | Status: DC | PRN

## 2015-05-11 MED ORDER — SODIUM CHLORIDE 0.9 % IJ SOLN
3.0000 mL | Freq: Two times a day (BID) | INTRAMUSCULAR | Status: DC
  Administered 2015-05-11 – 2015-05-16 (×10): 3 mL via INTRAVENOUS

## 2015-05-11 MED ORDER — CETYLPYRIDINIUM CHLORIDE 0.05 % MT LIQD
7.0000 mL | Freq: Two times a day (BID) | OROMUCOSAL | Status: DC
  Administered 2015-05-11 – 2015-05-16 (×9): 7 mL via OROMUCOSAL

## 2015-05-11 MED ORDER — ACETAMINOPHEN 650 MG RE SUPP: 650.0000 mg | Freq: Four times a day (QID) | RECTAL | Status: DC | PRN

## 2015-05-11 MED ORDER — ENSURE ENLIVE PO LIQD
237.0000 mL | Freq: Two times a day (BID) | ORAL | Status: DC
  Administered 2015-05-12 – 2015-05-16 (×9): 237 mL via ORAL

## 2015-05-11 MED ORDER — IPRATROPIUM-ALBUTEROL 0.5-2.5 (3) MG/3ML IN SOLN
3.0000 mL | Freq: Once | RESPIRATORY_TRACT | Status: DC
  Filled 2015-05-11: qty 3

## 2015-05-11 MED ORDER — MONTELUKAST SODIUM 10 MG PO TABS
10.0000 mg | ORAL_TABLET | Freq: Every day | ORAL | Status: DC
  Administered 2015-05-11 – 2015-05-15 (×5): 10 mg via ORAL
  Filled 2015-05-11 (×5): qty 1

## 2015-05-11 MED ORDER — MOMETASONE FURO-FORMOTEROL FUM 100-5 MCG/ACT IN AERO
2.0000 | INHALATION_SPRAY | Freq: Two times a day (BID) | RESPIRATORY_TRACT | Status: DC
  Administered 2015-05-11 – 2015-05-12 (×2): 2 via RESPIRATORY_TRACT
  Filled 2015-05-11: qty 8.8

## 2015-05-11 MED ORDER — LEVOFLOXACIN IN D5W 750 MG/150ML IV SOLN
750.0000 mg | INTRAVENOUS | Status: DC
  Administered 2015-05-11: 750 mg via INTRAVENOUS
  Filled 2015-05-11: qty 150

## 2015-05-11 MED ORDER — LORAZEPAM 0.5 MG PO TABS
0.5000 mg | ORAL_TABLET | Freq: Two times a day (BID) | ORAL | Status: DC | PRN
  Administered 2015-05-11 – 2015-05-15 (×2): 0.5 mg via ORAL
  Filled 2015-05-11 (×2): qty 1

## 2015-05-11 MED ORDER — MAGNESIUM SULFATE 2 GM/50ML IV SOLN
2.0000 g | Freq: Once | INTRAVENOUS | Status: AC
  Administered 2015-05-11: 2 g via INTRAVENOUS
  Filled 2015-05-11: qty 50

## 2015-05-11 MED ORDER — MAGNESIUM SULFATE 2 GM/50ML IV SOLN: 2.0000 g | INTRAVENOUS | Status: DC

## 2015-05-11 NOTE — ED Notes (Signed)
Transporting pt to room 

## 2015-05-11 NOTE — ED Notes (Signed)
Pt returns from xray. Placed back on monitoring 

## 2015-05-11 NOTE — ED Notes (Signed)
Dr. Manus Gunning at bedside with Resp tech.

## 2015-05-11 NOTE — Progress Notes (Signed)
Pt states she switched insurance company a month ago, when [place]. Pt states it was due to insurance changes. Pt will need PCP prior to discharge. Case management consult entered. Cecille Rubin, RN

## 2015-05-11 NOTE — ED Notes (Signed)
Steward Drone, RN notified PA of oxygen sats, Respiratory notified and en route to bedside to do additional breathing [email or web address].[id number]

## 2015-05-11 NOTE — ED Notes (Signed)
hospitalist at bedside

## 2015-05-11 NOTE — ED Notes (Signed)
Reports increased wheezing and SOB for a week; doing breathing treatments and inhaler at home. Worse this morning. Audible wheezing, speaking in broken sentences. Has not had a breathing treatment or used inhaler today. Denies pain. Reports nasal and chest congestion for a week; denies fever or chills.

## 2015-05-11 NOTE — Consult Note (Signed)
PULMONARY / CRITICAL CARE MEDICINE   Name: [name] MRN: [id number] DOB: [birth year]    ADMISSION DATE:  05/11/2015 CONSULTATION DATE:  05/11/2015  REFERRING MD : Vanessa Barbara, [facility]  CHIEF COMPLAINT:  Shortness of breath  INITIAL PRESENTATION: 74 year old female lifelong nonsmoker but with a history of asthma presented to the [facility] department on 05/11/2015 complaining of shortness of breath. Felt to have an asthma attack. Pulmonary critical care medicine was consulted  STUDIES:    SIGNIFICANT EVENTS:    HISTORY OF PRESENT ILLNESS:  This is a pleasant 74 year old lifelong nonsmoker who carries a diagnosis of asthma who came to our emergency department today complaining of shortness of breath. She tells me that for 5 days she's had increasing sinus congestion, headache, fatigue associated with shortness of breath, cough, and wheezing. She denies any sick contacts. She says that the symptoms progressed over the course of the last few days. She started using her albuterol nebulizer as well as her inhaler (which I believe is Symbicort). She said that these were no longer helping by today so she came in for further evaluation. She says that her cough has been productive of copious amounts of sputum. She's had chills but no fever. She denies chest pain or leg swelling. She tells me that she's never smoked. She also says that she only uses Symbicort on an as-needed basis. She has used multiple inhalers in the past. She has never seen pulmonary in clinic. She has never had a lung function test. 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  PAST MEDICAL HISTORY :   has a past medical history of Depression; Asthma; GERD (gastroesophageal reflux disease); Hypertension; Dysphagia; Arthritis; Anemia; and History of blood transfusion (2015).  has  past surgical history that includes Tooth extraction; Joint replacement (Left, 2010); Abdominal hysterectomy (age 74); Eye surgery (age 55); Esophagogastroduodenoscopy (egd) with propofol (N/A, 03/28/2014); Balloon dilation (N/A, 03/28/2014); and Colonoscopy with propofol (N/A, 03/28/2014). Prior to Admission medications   Medication Sig Start Date End Date Taking? Authorizing Provider  buPROPion (WELLBUTRIN XL) 300 MG 24 hr tablet Take 300 mg by mouth daily. 07/24/14  Yes Historical Provider, MD  DULoxetine (CYMBALTA) 60 MG capsule Take 60 mg by mouth daily.   Yes Historical Provider, MD  LORazepam (ATIVAN) 0.5 MG tablet Take 0.5 mg by mouth 2 (two) times daily as needed for anxiety.   Yes Historical Provider, MD  montelukast (SINGULAIR) 10 MG tablet Take 10 mg by mouth at bedtime.   Yes Historical Provider, MD  PRESCRIPTION MEDICATION Take 1 vial by nebulization 2 (two) times daily as needed (shortness of breath). Nebulizer treatment   Yes Historical Provider, MD  PROAIR HFA 108 (90 BASE) MCG/ACT inhaler Inhale 2 puffs into the lungs every 4 (four) hours as needed. 07/24/14  Yes Historical Provider, MD  temazepam (RESTORIL) 15 MG capsule Take 15 mg by mouth daily. 07/24/14  Yes Historical Provider, MD  traMADol (ULTRAM) 50 MG tablet Take 50 mg by mouth every 6 (six) hours as needed for pain.   Yes Historical Provider, MD   Allergies  Allergen Reactions  . Aspirin Nausea And Vomiting  . Codeine Nausea And Vomiting    FAMILY HISTORY:  has no family status information on file.  SOCIAL HISTORY:  reports that she has never smoked. She has never used smokeless tobacco.  She reports that she does not drink alcohol or use illicit drugs.  REVIEW OF SYSTEMS:   Gen: Denies fever, notes some chills, denies weight change, notes some fatigue, denies night sweats HEENT: Denies blurred vision, double vision, hearing loss, tinnitus, sinus congestion, rhinorrhea, sore throat, neck stiffness, dysphagia PULM: Per  history of present illness CV: Denies chest pain, edema, orthopnea, paroxysmal nocturnal dyspnea, palpitations GI: Denies abdominal pain, nausea, vomiting, diarrhea, hematochezia, melena, constipation, change in bowel habits GU: Denies dysuria, hematuria, polyuria, oliguria, urethral discharge Endocrine: Denies hot or cold intolerance, polyuria, polyphagia or appetite change Derm: Denies rash, dry skin, scaling or peeling skin change Heme: Denies easy bruising, bleeding, bleeding gums Neuro: Denies headache, numbness, weakness, slurred speech, loss of memory or consciousness   SUBJECTIVE: As above  VITAL SIGNS: Temp:  [98.2 F (36.8 C)] 98.2 F (36.8 C) (08/21 0913) Pulse Rate:  [94-119] 110 (08/21 1514) Resp:  [17-28] 20 (08/21 1514) BP: (113-161)/(62-102) 113/62 mmHg (08/21 1430) SpO2:  [87 %-100 %] 98 % (08/21 1516) Weight:  [79.379 kg (175 lb)] 79.379 kg (175 lb) (08/21 0913) HEMODYNAMICS:   VENTILATOR SETTINGS:   INTAKE / OUTPUT: No intake or output data in the 24 hours ending 05/11/15 1617  PHYSICAL EXAMINATION: General:  Resting comfortably in bed Neuro:  Awake alert, oriented 4, moves all 4 extremities HEENT:  NCAT, oropharynx clear Cardiovascular:  Regular rate and rhythm no clear murmur, cannot appreciate a gallop, JVD appears mildly elevated Lungs:  Significant wheezing bilaterally, air movement mildly decreased, normal respiratory effort Abdomen:  Bowel sounds positive, nontender nondistended Musculoskeletal:  Normal bulk and tone Skin:  No rash or skin breakdown  LABS:  CBC  Recent Labs Lab 05/11/15 0932  WBC 6.8  HGB 11.7*  HCT 35.2*  PLT 239   Coag's No results for input(s): APTT, INR in the last 168 hours. BMET  Recent Labs Lab 05/11/15 0932  NA 144  K 3.5  CL 110  CO2 27  BUN 14  CREATININE 1.18*  GLUCOSE 101*   Electrolytes  Recent Labs Lab 05/11/15 0932  CALCIUM 8.8*   Sepsis Markers No results for input(s): LATICACIDVEN,  PROCALCITON, O2SATVEN in the last 168 hours. ABG  Recent Labs Lab 05/11/15 1152  PHART 7.348*  PCO2ART 43.2  PO2ART 77.0*   Liver Enzymes No results for input(s): AST, ALT, ALKPHOS, BILITOT, ALBUMIN in the last 168 hours. Cardiac Enzymes No results for input(s): TROPONINI, PROBNP in the last 168 hours. Glucose No results for input(s): GLUCAP in the last 168 hours.  Imaging Chest x-ray images personally reviewed showing mild hyperinflation, normal tracheal air column, no pulmonary parenchymal infiltrate or pleural effusion   ASSESSMENT / PLAN:  PULMONARY OETT nonapplicable A: Acute asthma exacerbation likely due to a viral upper respiratory infection. It sounds as if she has mild intermittent to mild persistent asthma. She does not use her Symbicort inhaler at baseline on a regular basis. The differential diagnosis includes mild pulmonary edema from congestive heart failure she has trace ankle edema and elevated JVD, but her chest x-ray does not show pulmonary edema and her proBNP is reassuring. P:   Continue Solu-Medrol 60 IV every 6 hours Continue inhaled steroid/long acting bronchodilators Agree with albuterol every 4 hours Repeat chest x-ray in morning Admit to MedSurg Will arrange pulmonary follow-up prior to discharge We'll need to stress the importance of taking Symbicort on a regular basis rather than as needed again prior to discharge (I counseled her on this today)  Heber Antelope, MD [facility] Pager: [phone] Cell: [phone] After 3pm or if no response, call [phone]   05/11/2015, 4:17 PM

## 2015-05-11 NOTE — ED Provider Notes (Signed)
CSN: [id number]     Arrival date & time 05/11/15  0903 History   First MD Initiated Contact with Patient 05/11/15 [phone]     Chief Complaint  Patient presents with  . Asthma     (Consider location/radiation/quality/duration/timing/severity/associated sxs/prior Treatment) HPI Comments: Reports increased wheezing and SOB for a week; doing breathing treatments and inhaler at home. Worse this morning. Has not had a breathing treatment or used inhaler today. Denies pain. Reports nasal and chest congestion for a week; denies fever or chills.   Patient is a 74 y.o. female presenting with asthma and wheezing. The history is provided by the patient.  Asthma Associated symptoms include coughing and a sore throat. Pertinent negatives include no fever.  Wheezing Severity:  Severe Severity compared to prior episodes:  Similar Onset quality:  Gradual Duration:  1 week Timing:  Constant Progression:  Worsening Chronicity:  Recurrent Relieved by:  Nothing Worsened by:  Supine position Ineffective treatments:  Beta-agonist inhaler and nebulizer treatments Associated symptoms: chest tightness, cough, rhinorrhea and sore throat   Associated symptoms: no fever   Risk factors: no prior ICU admissions and no prior intubations     Past Medical History  Diagnosis Date  . Depression   . Asthma   . GERD (gastroesophageal reflux disease)   . Hypertension     off all bp meds for last year  . Dysphagia     trouble swallowing since april 2015  . Arthritis   . Anemia   . History of blood transfusion 2015   Past Surgical History  Procedure Laterality Date  . Tooth extraction    . Joint replacement Left 2010    knee  . Abdominal hysterectomy  age 64  . Eye surgery  age 4    for cross eyed  . Esophagogastroduodenoscopy (egd) with propofol N/A 03/28/2014    Procedure: ESOPHAGOGASTRODUODENOSCOPY (EGD) WITH PROPOFOL;  Surgeon: Charolett Bumpers, MD;  Location: [facility];  Service: Endoscopy;   Laterality: N/A;  . Balloon dilation N/A 03/28/2014    Procedure: BALLOON DILATION;  Surgeon: Charolett Bumpers, MD;  Location: [facility];  Service: Endoscopy;  Laterality: N/A;  . Colonoscopy with propofol N/A 03/28/2014    Procedure: COLONOSCOPY WITH PROPOFOL;  Surgeon: Charolett Bumpers, MD;  Location: [facility];  Service: Endoscopy;  Laterality: N/A;   History reviewed. No pertinent family history. Social History  Substance Use Topics  . Smoking status: Never Smoker   . Smokeless tobacco: Never Used  . Alcohol Use: No   OB History    No data available     Review of Systems  Constitutional: Negative for fever.  HENT: Positive for rhinorrhea and sore throat.   Respiratory: Positive for cough, chest tightness and wheezing.   All other systems reviewed and are negative.     Allergies  Aspirin and Codeine  Home Medications   Prior to Admission medications   Medication Sig Start Date End Date Taking? Authorizing Provider  buPROPion (WELLBUTRIN XL) 300 MG 24 hr tablet Take 300 mg by mouth daily. 07/24/14  Yes Historical Provider, MD  DULoxetine (CYMBALTA) 60 MG capsule Take 60 mg by mouth daily.   Yes Historical Provider, MD  LORazepam (ATIVAN) 0.5 MG tablet Take 0.5 mg by mouth 2 (two) times daily as needed for anxiety.   Yes Historical Provider, MD  montelukast (SINGULAIR) 10 MG tablet Take 10 mg by mouth at bedtime.   Yes Historical Provider, MD  PRESCRIPTION MEDICATION Take 1 vial by nebulization  2 (two) times daily as needed (shortness of breath). Nebulizer treatment   Yes Historical Provider, MD  PROAIR HFA 108 (90 BASE) MCG/ACT inhaler Inhale 2 puffs into the lungs every 4 (four) hours as needed. 07/24/14  Yes Historical Provider, MD  temazepam (RESTORIL) 15 MG capsule Take 15 mg by mouth daily. 07/24/14  Yes Historical Provider, MD  traMADol (ULTRAM) 50 MG tablet Take 50 mg by mouth every 6 (six) hours as needed for pain.   Yes Historical Provider, MD   BP 148/95 mmHg   Pulse 108  Temp(Src) 98.2 F (36.8 C) (Oral)  Resp 28  Ht 5\' 2"  (1.575 m)  Wt 175 lb (79.379 kg)  BMI 32.00 kg/m2  SpO2 97% Physical Exam  Constitutional: She is oriented to person, place, and time. She appears well-developed and well-nourished. No distress.  HENT:  Head: Normocephalic and atraumatic.  Right Ear: External ear normal.  Left Ear: External ear normal.  Mouth/Throat: Oropharynx is clear and moist.  Eyes: Conjunctivae are normal.  Neck: Neck supple.  Cardiovascular: Normal rate, regular rhythm and normal heart sounds.   Pulmonary/Chest: She has wheezes.  Mild accessory muscle use. Inspiratory and expiratory wheezing. No retractions or nasal flaring  Abdominal: Soft. There is no tenderness.  Musculoskeletal: Normal range of motion.  Neurological: She is alert and oriented to person, place, and time.  Skin: Skin is warm and dry. She is not diaphoretic.  Nursing note and vitals reviewed.   ED Course  Procedures (including critical care time) Medications  magnesium sulfate IVPB 2 g 50 mL (not administered)  albuterol (PROVENTIL) (2.5 MG/3ML) 0.083% nebulizer solution 5 mg (5 mg Nebulization Given 05/11/15 0912)  methylPREDNISolone sodium succinate (SOLU-MEDROL) 125 mg/2 mL injection 125 mg (125 mg Intravenous Given 05/11/15 0934)  albuterol (PROVENTIL,VENTOLIN) solution continuous neb (15 mg/hr Nebulization Given 05/11/15 0958)  ipratropium (ATROVENT) nebulizer solution 0.5 mg (0.5 mg Nebulization Given 05/11/15 0959)  magnesium sulfate IVPB 2 g 50 mL (0 g Intravenous Stopped 05/11/15 1119)  albuterol (PROVENTIL,VENTOLIN) solution continuous neb (15 mg/hr Nebulization Given 05/11/15 1141)  ipratropium (ATROVENT) nebulizer solution 0.5 mg (0.5 mg Nebulization Given 05/11/15 1141)    Labs Review Labs Reviewed  BASIC METABOLIC PANEL - Abnormal; Notable for the following:    Glucose, Bld 101 (*)    Creatinine, Ser 1.18 (*)    Calcium 8.8 (*)    GFR calc non Af Amer 44 (*)     GFR calc Af Amer 51 (*)    All other components within normal limits  CBC WITH DIFFERENTIAL/PLATELET - Abnormal; Notable for the following:    Hemoglobin 11.7 (*)    HCT 35.2 (*)    Eosinophils Relative 12 (*)    Eosinophils Absolute 0.8 (*)    All other components within normal limits  I-STAT ARTERIAL BLOOD GAS, ED - Abnormal; Notable for the following:    pH, Arterial 7.348 (*)    pO2, Arterial 77.0 (*)    All other components within normal limits  BRAIN NATRIURETIC PEPTIDE    Imaging Review Dg Chest 2 View  05/11/2015   CLINICAL DATA:  Cough. Asthma. Short of breath for 1 week. Initial encounter.  EXAM: CHEST  2 VIEW  COMPARISON:  07/30/2014.  02/22/2008.  FINDINGS: Heart size within normal limits for projection. Chronic tortuosity of the thoracic aorta with arch atherosclerosis. Chronic pleural thickening/ scarring. No airspace disease. No pleural effusion.  IMPRESSION: Chronic changes of the chest without acute cardiopulmonary disease.   Electronically Signed  By: Andreas Newport M.D.   On: 05/11/2015 10:12   I have personally reviewed and evaluated these images and lab results as part of my medical decision-making.   EKG Interpretation   Date/Time:  :36 AM On re-evaluation patient de-sating to 84% on RA placed on 6L [place] with improvement. Continued accessory muscle use and wheezing will place back on CAT give mag and obtain Abg  CRITICAL CARE Performed by: Francee Piccolo L   Total critical care time: 35 minutes  Critical care time was exclusive of separately billable procedures and treating other patients.  Critical care was  necessary to treat or prevent imminent or life-threatening deterioration.  Critical care was time spent personally by me on the following activities: development of treatment plan with patient and/or surrogate as well as nursing, discussions with consultants, evaluation of patient's response to treatment, examination of patient, obtaining history from patient or surrogate, ordering and performing treatments and interventions, ordering and review of laboratory studies, ordering and review of radiographic studies, pulse oximetry and re-evaluation of patient's condition.  MDM   Final diagnoses:  Status asthmaticus, severe persistent    Filed Vitals:   05/11/15 1143  BP:   Pulse: 108  Temp:   Resp: 28   Asthma exacerbation. On arrival into the joint and 3 wheezing with accessory muscle use noted. Patient given nebulizer treatment with no improvement place on an hour-long continuous albuterol nebulizer with Atrovent. IV Solu-Medrol administered. On reevaluation patient hypoxic on room air to 84% return of accessory muscle use and intratracheal x-ray wheezes. Will place back on hour-long continuous nebulizer give IV and obtain ABG. Labs reviewed. X-ray reviewed. Will admit patient for status asthmaticus to hospitalist to stepdown unit. Patient d/w with Dr. Manus Gunning, agrees with plan.      Francee Piccolo, PA-C 05/11/15 1408  Glynn Octave, MD 05/11/15 [phone]

## 2015-05-11 NOTE — H&P (Signed)
[facility] History and Physical  [name] NFA:[id number] DOB: 12/20/1940 DOA: 05/11/2015  Referring physician:  PCP: Lillia Mountain, MD   Chief Complaint: Shortness of breath  HPI: [name] is a 74 y.o. female of a past medical history of asthma, major depression, also arthritis, presented to the emergency department with complaints of shortness of breath associate with wheezing. She reports that symptoms started about a week ago and had become progressively worse since then. She has needed to use her inhalers and home nebulizer treatments even more frequently in the past 24-48 hours. She reports associated cough and wheezing. Unable to obtain relief of symptoms she presented to the emergency room this morning. She was found to be in acute hypoxemic respiratory failure having diminished breath sounds and extensive wheezing. She was given nebulizer treatments along with IV steroids. During my evaluation she reports feeling a little better after these interventions however remains quite short of breath. She denies fevers, chills, chest pain, abdominal pain, hematuria, dysuria, diarrhea or constipation.                        Review of Systems:  Constitutional:  No weight loss, night sweats, Fevers, chills, fatigue.  HEENT:  No headaches, Difficulty swallowing,Tooth/dental problems,Sore throat,  No sneezing, itching, ear ache, nasal congestion, post nasal drip,  Cardio-vascular:  No chest pain, Orthopnea, PND, swelling in lower extremities, anasarca, dizziness, palpitations  GI:  No heartburn, indigestion, abdominal pain, nausea, vomiting, diarrhea, change in bowel habits, loss of appetite  Resp:  Positive for shortness of breath with exertion or at rest. No excess mucus, no productive cough, No non-productive cough, No coughing up of blood.No change in color of mucus.No wheezing.No chest wall deformity  Skin:  no rash or lesions.  GU:  no dysuria, change in  color of urine, no urgency or frequency. No flank pain.  Musculoskeletal:  No joint pain or swelling. No decreased range of motion. No back pain.  Psych:  No change in mood or affect. No depression or anxiety. No memory loss.   Past Medical History  Diagnosis Date  . Depression   . Asthma   . GERD (gastroesophageal reflux disease)   . Hypertension     off all bp meds for last year  . Dysphagia     trouble swallowing since april 2015  . Arthritis   . Anemia   . History of blood transfusion 2015   Past Surgical History  Procedure Laterality Date  . Tooth extraction    . Joint replacement Left 2010    knee  . Abdominal hysterectomy  age 30  . Eye surgery  age 55    for cross eyed  . Esophagogastroduodenoscopy (egd) with propofol N/A 03/28/2014    Procedure: ESOPHAGOGASTRODUODENOSCOPY (EGD) WITH PROPOFOL;  Surgeon: Charolett Bumpers, MD;  Location: [facility];  Service: Endoscopy;  Laterality: N/A;  . Balloon dilation N/A 03/28/2014    Procedure: BALLOON DILATION;  Surgeon: Charolett Bumpers, MD;  Location: [facility];  Service: Endoscopy;  Laterality: N/A;  . Colonoscopy with propofol N/A 03/28/2014    Procedure: COLONOSCOPY WITH PROPOFOL;  Surgeon: Charolett Bumpers, MD;  Location: [facility];  Service: Endoscopy;  Laterality: N/A;   Social History:  reports that she has never smoked. She has never used smokeless tobacco. She reports that she does not drink alcohol or use illicit drugs.  Allergies  Allergen Reactions  . Aspirin Nausea And Vomiting  .  Codeine Nausea And Vomiting    History reviewed. No pertinent family history.   Prior to Admission medications   Medication Sig Start Date End Date Taking? Authorizing Provider  buPROPion (WELLBUTRIN XL) 300 MG 24 hr tablet Take 300 mg by mouth daily. 07/24/14  Yes Historical Provider, MD  DULoxetine (CYMBALTA) 60 MG capsule Take 60 mg by mouth daily.   Yes Historical Provider, MD  LORazepam (ATIVAN) 0.5 MG tablet Take 0.5 mg by  mouth 2 (two) times daily as needed for anxiety.   Yes Historical Provider, MD  montelukast (SINGULAIR) 10 MG tablet Take 10 mg by mouth at bedtime.   Yes Historical Provider, MD  PRESCRIPTION MEDICATION Take 1 vial by nebulization 2 (two) times daily as needed (shortness of breath). Nebulizer treatment   Yes Historical Provider, MD  PROAIR HFA 108 (90 BASE) MCG/ACT inhaler Inhale 2 puffs into the lungs every 4 (four) hours as needed. 07/24/14  Yes Historical Provider, MD  temazepam (RESTORIL) 15 MG capsule Take 15 mg by mouth daily. 07/24/14  Yes Historical Provider, MD  traMADol (ULTRAM) 50 MG tablet Take 50 mg by mouth every 6 (six) hours as needed for pain.   Yes Historical Provider, MD   Physical Exam: Filed Vitals:   05/11/15 1100 05/11/15 1130 05/11/15 1143 05/11/15 1200  BP: 148/95 130/81  154/89  Pulse: 99 108 108 108  Temp:      TempSrc:      Resp: 24 19 28 17   Height:      Weight:      SpO2: 100% 87% 97% 99%    Wt Readings from Last 3 Encounters:  05/11/15 79.379 kg (175 lb)  07/30/14 81.647 kg (180 lb)  03/28/14 81.647 kg (180 lb)    General: Ill-appearing, currently on nebulizer treatment. She appears mildly short of breath particularly with talking. However she is awake and alert and can provide history. Eyes: PERRL, normal lids, irises & conjunctiva ENT: grossly normal hearing, lips & tongue Neck: no LAD, masses or thyromegaly Cardiovascular: Tachycardic, RRR, no m/r/g. No LE edema. Telemetry: SR, no arrhythmias  Respiratory: She has diminished breath sounds bilaterally with presence of expiratory wheezes. Prolonged expiratory phase. On nebulizer treatments. Abdomen: soft, ntnd Skin: no rash or induration seen on limited exam Musculoskeletal: grossly normal tone BUE/BLE Psychiatric: grossly normal mood and affect, speech fluent and appropriate Neurologic: grossly non-focal.          Labs on Admission:  Basic Metabolic Panel:  Recent Labs Lab 05/11/15 0932    NA 144  K 3.5  CL 110  CO2 27  GLUCOSE 101*  BUN 14  CREATININE 1.18*  CALCIUM 8.8*   Liver Function Tests: No results for input(s): AST, ALT, ALKPHOS, BILITOT, PROT, ALBUMIN in the last 168 hours. No results for input(s): LIPASE, AMYLASE in the last 168 hours. No results for input(s): AMMONIA in the last 168 hours. CBC:  Recent Labs Lab 05/11/15 0932  WBC 6.8  NEUTROABS 3.6  HGB 11.7*  HCT 35.2*  MCV 87.6  PLT 239   Cardiac Enzymes: No results for input(s): CKTOTAL, CKMB, CKMBINDEX, TROPONINI in the last 168 hours.  BNP (last 3 results)  Recent Labs  05/11/15 0932  BNP 14.0    ProBNP (last 3 results) No results for input(s): PROBNP in the last 8760 hours.  CBG: No results for input(s): GLUCAP in the last 168 hours.  Radiological Exams on Admission: Dg Chest 2 View  05/11/2015   CLINICAL DATA:  Cough. Asthma.  Short of breath for 1 week. Initial encounter.  EXAM: CHEST  2 VIEW  COMPARISON:  07/30/2014.  02/22/2008.  FINDINGS: Heart size within normal limits for projection. Chronic tortuosity of the thoracic aorta with arch atherosclerosis. Chronic pleural thickening/ scarring. No airspace disease. No pleural effusion.  IMPRESSION: Chronic changes of the chest without acute cardiopulmonary disease.   Electronically Signed   By: Andreas Newport M.D.   On: 05/11/2015 10:12    EKG: Independently reviewed.   Assessment/Plan Principal Problem:   Acute respiratory failure Active Problems:   Asthma exacerbation   Status asthmaticus   1. Acute asthmatic exacerbation. Patient having a history of asthma presenting with worsening shortness of breath associate with wheezing in the past 2 days despite home nebulizer treatments. She reports symptoms started about a week ago. Initial workup included a chest x-ray that did not reveal acute infiltrate. Will treat asthmatic exacerbation with IV steroids, Solu-Medrol 60 mg IV every 6 hours along with scheduled albuterol  nebulizer treatments and inhaled steroids therapy. Will admit her to the step down unit for close monitoring. 2. Acute hypoxemic respiratory failure. Evidence by respiratory rate of 28, O2 sat of 87%, requiring 5 L of oxygen in the emergency department, presenting with acute respiratory distress. Symptoms secondary to acute asthmatic exacerbation and will treat with IV steroids, nebulizer treatments, admission to the step down unit for close monitoring.    Code Status: Full code DVT Prophylaxis: Lovenox Family Communication: Family not present Disposition Plan: Will admit to step down unit, and his facial require greater than 2 nights hospitalization  Time spent: 65 min  Jeralyn Bennett [facility] Pager [phone]

## 2015-05-11 NOTE — ED Notes (Signed)
Pt states she "feels like she is smothering" pt lungs rhonchi and expiratory wheezing, Jeff resp tech called for assessment and recommendations of pt.

## 2015-05-11 NOTE — ED Notes (Signed)
Patient transported to X-ray 

## 2015-05-12 DIAGNOSIS — N183 Chronic kidney disease, stage 3 unspecified: Secondary | ICD-10-CM | POA: Diagnosis present

## 2015-05-12 DIAGNOSIS — J00 Acute nasopharyngitis [common cold]: Secondary | ICD-10-CM | POA: Diagnosis present

## 2015-05-12 DIAGNOSIS — J4552 Severe persistent asthma with status asthmaticus: Secondary | ICD-10-CM

## 2015-05-12 LAB — BASIC METABOLIC PANEL
Anion gap: 10 (ref 5–15)
BUN: 17 mg/dL (ref 6–20)
CALCIUM: 8.8 mg/dL — AB (ref 8.9–10.3)
CO2: 23 mmol/L (ref 22–32)
CREATININE: 1.29 mg/dL — AB (ref 0.44–1.00)
Chloride: 107 mmol/L (ref 101–111)
GFR calc non Af Amer: 39 mL/min — ABNORMAL LOW (ref >60)
GFR, EST AFRICAN AMERICAN: 46 mL/min — AB (ref >60)
Glucose, Bld: 169 mg/dL — ABNORMAL HIGH (ref 65–99)
Potassium: 3.9 mmol/L (ref 3.5–5.1)
SODIUM: 140 mmol/L (ref 135–145)

## 2015-05-12 LAB — CBC
HCT: 33.8 % — ABNORMAL LOW (ref 36.0–46.0)
Hemoglobin: 11.4 g/dL — ABNORMAL LOW (ref 12.0–15.0)
MCH: 29.1 pg (ref 26.0–34.0)
MCHC: 33.7 g/dL (ref 30.0–36.0)
MCV: 86.2 fL (ref 78.0–100.0)
PLATELETS: 251 10*3/uL (ref 150–400)
RBC: 3.92 MIL/uL (ref 3.87–5.11)
RDW: 14.6 % (ref 11.5–15.5)
WBC: 10 10*3/uL (ref 4.0–10.5)

## 2015-05-12 LAB — GLUCOSE, CAPILLARY: GLUCOSE-CAPILLARY: 151 mg/dL — AB (ref 65–99)

## 2015-05-12 MED ORDER — BUDESONIDE 0.5 MG/2ML IN SUSP
0.5000 mg | Freq: Two times a day (BID) | RESPIRATORY_TRACT | Status: DC
  Administered 2015-05-12 – 2015-05-16 (×9): 0.5 mg via RESPIRATORY_TRACT
  Filled 2015-05-12 (×9): qty 2

## 2015-05-12 MED ORDER — LEVOFLOXACIN 750 MG PO TABS
750.0000 mg | ORAL_TABLET | ORAL | Status: DC
  Administered 2015-05-13 – 2015-05-15 (×2): 750 mg via ORAL
  Filled 2015-05-12 (×2): qty 1

## 2015-05-12 MED ORDER — SALINE SPRAY 0.65 % NA SOLN
2.0000 | Freq: Two times a day (BID) | NASAL | Status: DC
Start: 2015-05-12 — End: 2015-05-16
  Administered 2015-05-12 – 2015-05-16 (×9): 2 via NASAL
  Filled 2015-05-12: qty 44

## 2015-05-12 MED ORDER — ALBUTEROL SULFATE (2.5 MG/3ML) 0.083% IN NEBU
2.5000 mg | INHALATION_SOLUTION | Freq: Four times a day (QID) | RESPIRATORY_TRACT | Status: AC
  Administered 2015-05-12 – 2015-05-13 (×4): 2.5 mg via RESPIRATORY_TRACT
  Filled 2015-05-12 (×4): qty 3

## 2015-05-12 MED ORDER — ARFORMOTEROL TARTRATE 15 MCG/2ML IN NEBU
15.0000 ug | INHALATION_SOLUTION | Freq: Two times a day (BID) | RESPIRATORY_TRACT | Status: DC
  Administered 2015-05-12 – 2015-05-16 (×8): 15 ug via RESPIRATORY_TRACT
  Filled 2015-05-12 (×13): qty 2

## 2015-05-12 MED ORDER — FLUTICASONE PROPIONATE 50 MCG/ACT NA SUSP
2.0000 | Freq: Every day | NASAL | Status: DC
  Administered 2015-05-12 – 2015-05-16 (×5): 2 via NASAL
  Filled 2015-05-12: qty 16

## 2015-05-12 MED ORDER — LORATADINE 10 MG PO TABS
10.0000 mg | ORAL_TABLET | Freq: Every day | ORAL | Status: DC
  Administered 2015-05-12 – 2015-05-16 (×5): 10 mg via ORAL
  Filled 2015-05-12 (×5): qty 1

## 2015-05-12 NOTE — Progress Notes (Signed)
PCCM PROGRESS NOTE  ADMISSION DATE: 05/11/2015 CONSULT DATE: 05/11/2015 REFERRING PROVIDER: [facility]  CC: Short of breath  SUBJECTIVE: Feels better.  Still has cough, wheeze, and chest discomfort from cough.  Also has sinus congestion.  OBJECTIVE: Temp:  [97.8 F (36.6 C)-99 F (37.2 C)] 98.3 F (36.8 C) (08/22 0746) Pulse Rate:  [94-119] 96 (08/22 0822) Resp:  [17-28] 18 (08/22 0822) BP: (113-166)/(62-99) 166/79 mmHg (08/22 0822) SpO2:  [87 %-100 %] 93 % (08/22 0841) Weight:  [169 lb 11.2 oz (76.975 kg)] 169 lb 11.2 oz (76.975 kg) (08/22 0415) General: pleasant HEENT: clear nasal drainage Cardiac: regular Chest: b/l expiratory wheeze Abd: soft, non tender Ext: no edema Neuro: normal strength Skin: no rashes   CMP Latest Ref Rng 05/12/2015 05/11/2015 07/30/2014  Glucose 65 - 99 mg/dL 657(Q) 469(G) 85  BUN 6 - 20 mg/dL Creatinine 0.44 - 1.00 mg/dL 2.95(M) 8.41(L) 2.44  Sodium 135 - 145 mmol/L 140 144 146  Potassium 3.5 - 5.1 mmol/L 3.9 3.5 3.3(L)  Chloride 101 - 111 mmol/L 107 110 104  CO2 22 - 32 mmol/L Calcium 8.9 - 10.3 mg/dL 0.1(U) 2.7(O) 9.7  Total Protein 6.0 - 8.3 g/dL - - 7.2  Total Bilirubin 0.3 - 1.2 mg/dL - - 0.5  Alkaline Phos 39 - 117 U/L - - 85  AST 0 - 37 U/L - - 21  ALT 0 - 35 U/L - - 13     CBC Latest Ref Rng 05/12/2015 05/11/2015 07/30/2014  WBC 4.0 - 10.5 K/uL 10.0 6.8 7.1  Hemoglobin 12.0 - 15.0 g/dL 11.4(L) 11.7(L) 12.1  Hematocrit 36.0 - 46.0 % 33.8(L) 35.2(L) 35.1(L)  Platelets 150 - 400 K/uL 251 239 229     Dg Chest 2 View  05/11/2015   CLINICAL DATA:  Cough. Asthma. Short of breath for 1 week. Initial encounter.  EXAM: CHEST  2 VIEW  COMPARISON:  07/30/2014.  02/22/2008.  FINDINGS: Heart size within normal limits for projection. Chronic tortuosity of the thoracic aorta with arch atherosclerosis. Chronic pleural thickening/ scarring. No airspace disease. No pleural effusion.  IMPRESSION: Chronic changes of the chest without  acute cardiopulmonary disease.   Electronically Signed   By: Andreas Newport M.D.   On: 05/11/2015 10:12    EVENTS: 8/21 Admit  DISCUSSION: 74 yo female with 5 days of sinus congestion, HA, fatigue, cough, wheeze, and dyspnea from asthma exacerbation.  ASSESSMENT/PLAN:  Acute hypoxic respiratory failure 2nd to acute asthma exacerbation with acute rhinitis. Plan: - day 2 of levaquin - continue solumedrol - continue singulair - add nasal saline sprays, claritin, flonase - continue dulera - adjust oxygen to keep SpO2 > 92%  ?of recent upper airway procedure. Plan: - hospitalist team to locate outpt medical records   Coralyn Helling, MD [facility] Pulmonary/Critical Care 05/12/2015, 11:52 AM Pager:  [phone] After 3pm call: [phone]

## 2015-05-12 NOTE — Progress Notes (Signed)
[facility] PROGRESS NOTE   [name] VWU:[id number] DOB: 01-23-1941 DOA: 05/11/2015 PCP: Lillia Mountain, MD  HPI/Subjective   Assessment/Plan: Principal Problem:   Acute respiratory failure Active Problems:   Asthma exacerbation   Status asthmaticus  Acute asthmatic exacerbation Patient having a history of asthma presenting with worsening shortness of breath associate with wheezing. Initial workup included a chest x-ray that did not reveal acute infiltrate.  Will treat asthmatic exacerbation with IV steroids, Solu-Medrol 60 mg IV every 6 hours along with scheduled albuterol nebulizer treatments and inhaled steroids therapy.   Acute hypoxemic respiratory failure Evidence by respiratory rate of 28, O2 sat of 87%, requiring 5 L of oxygen in the emergency department, presenting with acute respiratory distress.  Symptoms secondary to acute asthmatic exacerbation and will treat with IV steroids, nebulizer treatments. Admission to the step down unit for close monitoring. PCCM following  Acute kidney injury Last creatinine from November 2015 was 0.9, creatinine today is 1.29. Watch closely, check BMP in a.m.  Acute rhinitis Patient started on levofloxacin.  Code Status: Full Code Family Communication: Plan discussed with the patient. Disposition Plan: Remains inpatient Diet: DIET SOFT Room service appropriate?: Yes; Fluid consistency:: Thin  Consultants:  PCCM  Procedures:  None  Antibiotics:  Levaquin  Objective: Filed Vitals:   05/12/15 1236  BP: 133/75  Pulse: 103  Temp: 97.6 F (36.4 C)  Resp: 20    Intake/Output Summary (Last 24 hours) at 05/12/15 1303 Last data filed at 05/12/15 0803  Gross per 24 hour  Intake   1560 ml  Output      0 ml  Net   1560 ml   Filed Weights   05/11/15 0913 05/12/15 0415  Weight: 79.379 kg (175 lb) 76.975 kg (169 lb 11.2 oz)    Exam: General: Alert and awake, oriented x3, not in any acute  distress. HEENT: anicteric sclera, pupils reactive to light and accommodation, EOMI CVS: S1-S2 clear, no murmur rubs or gallops Chest: clear to auscultation bilaterally, no wheezing, rales or rhonchi Abdomen: soft nontender, nondistended, normal bowel sounds, no organomegaly Extremities: no cyanosis, clubbing or edema noted bilaterally Neuro: Cranial nerves II-XII intact, no focal neurological deficits  Data Reviewed: Basic Metabolic Panel:  Recent Labs Lab 05/11/15 0932 05/12/15 0407  NA 144 140  K 3.5 3.9  CL 110 107  CO2 27 23  GLUCOSE 101* 169*  BUN 14 17  CREATININE 1.18* 1.29*  CALCIUM 8.8* 8.8*   Liver Function Tests: No results for input(s): AST, ALT, ALKPHOS, BILITOT, PROT, ALBUMIN in the last 168 hours. No results for input(s): LIPASE, AMYLASE in the last 168 hours. No results for input(s): AMMONIA in the last 168 hours. CBC:  Recent Labs Lab 05/11/15 0932 05/12/15 0407  WBC 6.8 10.0  NEUTROABS 3.6  --   HGB 11.7* 11.4*  HCT 35.2* 33.8*  MCV 87.6 86.2  PLT 239 251   Cardiac Enzymes: No results for input(s): CKTOTAL, CKMB, CKMBINDEX, TROPONINI in the last 168 hours. BNP (last 3 results)  Recent Labs  05/11/15 0932  BNP 14.0    ProBNP (last 3 results) No results for input(s): PROBNP in the last 8760 hours.  CBG: No results for input(s): GLUCAP in the last 168 hours.  Micro Recent Results (from the past 240 hour(s))  MRSA PCR Screening     Status: None   Collection Time: 05/11/15  6:24 PM  Result Value Ref Range Status   MRSA by PCR NEGATIVE NEGATIVE Final  Comment:        The GeneXpert MRSA Assay (FDA approved for NASAL specimens only), is one component of a comprehensive MRSA colonization surveillance program. It is not intended to diagnose MRSA infection nor to guide or monitor treatment for MRSA infections.      Studies: Dg Chest 2 View  05/11/2015   CLINICAL DATA:  Cough. Asthma. Short of breath for 1 week. Initial encounter.   EXAM: CHEST  2 VIEW  COMPARISON:  07/30/2014.  02/22/2008.  FINDINGS: Heart size within normal limits for projection. Chronic tortuosity of the thoracic aorta with arch atherosclerosis. Chronic pleural thickening/ scarring. No airspace disease. No pleural effusion.  IMPRESSION: Chronic changes of the chest without acute cardiopulmonary disease.   Electronically Signed   By: Andreas Newport M.D.   On: 05/11/2015 10:12    Scheduled Meds: . albuterol  2.5 mg Nebulization Q6H  . antiseptic oral rinse  7 mL Mouth Rinse BID  . arformoterol  15 mcg Nebulization BID  . budesonide (PULMICORT) nebulizer solution  0.5 mg Nebulization BID  . buPROPion  300 mg Oral Daily  . DULoxetine  60 mg Oral Daily  . enoxaparin (LOVENOX) injection  40 mg Subcutaneous Q24H  . feeding supplement (ENSURE ENLIVE)  237 mL Oral BID BM  . fluticasone  2 spray Each Nare Daily  . [START ON 05/13/2015] levofloxacin  750 mg Oral Q48H  . loratadine  10 mg Oral Daily  . methylPREDNISolone (SOLU-MEDROL) injection  60 mg Intravenous Q6H  . montelukast  10 mg Oral QHS  . sodium chloride  2 spray Each Nare BID  . sodium chloride  3 mL Intravenous Q12H   Continuous Infusions:      Time spent: 35 minutes    [facility]  [facility] Pager [phone] If 7PM-7AM, please contact night-coverage at [email or web address], password [facility] 05/12/2015, 1:03 PM  LOS: 1 day

## 2015-05-12 NOTE — Progress Notes (Signed)
Initial Nutrition Assessment  DOCUMENTATION CODES:   Non-severe (moderate) malnutrition in context of acute illness/injury  INTERVENTION:  Ensure Enlive po BID, each supplement provides 350 kcal and 20 grams of protein. Continue soft diet.    NUTRITION DIAGNOSIS:   Malnutrition related to inability to eat, dysphagia, poor appetite as evidenced by mild depletion of body fat, mild depletion of muscle mass, energy intake < 75% for > 7 days.    GOAL:   Patient will meet greater than or equal to 90% of their needs    MONITOR:   PO intake, Skin, Diet advancement, Labs, I & O's  REASON FOR ASSESSMENT:   Malnutrition Screening Tool    ASSESSMENT:   pt presents for ARF, h/o asthma, SOB. Appears obese but exhibits muscle depletion at temples/upper extremities. Is receiving EEx2  Pt lives in assisted living home, reports walking daily. Claims she was attempting to lose weight, but also is not eating enough due to sore-throat/inability to swallow hard foods. Doctor used scope on her throat and she says it has not been the same since, going on 2 weeks.  Pt also reports lack of appetite, but does drink gatorade/soda consistently.  Pt reported taste changes in food consumed also causing her to eat less.  Along with muscle depletion at temples and upper body/ extremities, it appears to be mild-moderate malnutrition.  Diet Order:  DIET SOFT Room service appropriate?: Yes; Fluid consistency:: Thin  Skin:  Reviewed, no issues  Last BM:  05/10/2015  Height:   Ht Readings from Last 1 Encounters:  05/11/15  (1.575 m)    Weight:   Wt Readings from Last 1 Encounters:  05/12/15 169 lb 11.2 oz (76.975 kg)    Ideal Body Weight:  50 kg  BMI:  Body mass index is 31.03 kg/(m^2).  Estimated Nutritional Needs:   Kcal:  1500-1700  Protein:  75-90  Fluid:  >=/ 1.5L  EDUCATION NEEDS:   No education needs identified at this time  Dionne Ano. Garnie Borchardt, MS, RD LDN After  Hours/Weekend Pager [phone]

## 2015-05-12 NOTE — Progress Notes (Signed)
UR Completed Michaela Broski Graves-Bigelow, RN,BSN [phone]  

## 2015-05-13 DIAGNOSIS — J00 Acute nasopharyngitis [common cold]: Secondary | ICD-10-CM

## 2015-05-13 LAB — BASIC METABOLIC PANEL
Anion gap: 7 (ref 5–15)
BUN: 27 mg/dL — ABNORMAL HIGH (ref 6–20)
CO2: 24 mmol/L (ref 22–32)
Calcium: 9 mg/dL (ref 8.9–10.3)
Chloride: 107 mmol/L (ref 101–111)
Creatinine, Ser: 1.52 mg/dL — ABNORMAL HIGH (ref 0.44–1.00)
GFR calc Af Amer: 38 mL/min — ABNORMAL LOW (ref >60)
GFR calc non Af Amer: 32 mL/min — ABNORMAL LOW (ref >60)
Glucose, Bld: 166 mg/dL — ABNORMAL HIGH (ref 65–99)
Potassium: 4 mmol/L (ref 3.5–5.1)
Sodium: 138 mmol/L (ref 135–145)

## 2015-05-13 MED ORDER — IPRATROPIUM-ALBUTEROL 0.5-2.5 (3) MG/3ML IN SOLN
3.0000 mL | Freq: Four times a day (QID) | RESPIRATORY_TRACT | Status: DC
  Administered 2015-05-13 – 2015-05-14 (×3): 3 mL via RESPIRATORY_TRACT
  Filled 2015-05-13 (×3): qty 3

## 2015-05-13 MED ORDER — METHYLPREDNISOLONE SODIUM SUCC 40 MG IJ SOLR
40.0000 mg | Freq: Three times a day (TID) | INTRAMUSCULAR | Status: DC
  Administered 2015-05-13 – 2015-05-15 (×6): 40 mg via INTRAVENOUS
  Filled 2015-05-13 (×6): qty 1

## 2015-05-13 MED ORDER — SODIUM CHLORIDE 0.9 % IV SOLN
INTRAVENOUS | Status: DC
  Administered 2015-05-13: 1000 mL via INTRAVENOUS
  Administered 2015-05-13 – 2015-05-14 (×2): via INTRAVENOUS

## 2015-05-13 MED ORDER — IPRATROPIUM-ALBUTEROL 0.5-2.5 (3) MG/3ML IN SOLN
3.0000 mL | RESPIRATORY_TRACT | Status: DC | PRN
  Administered 2015-05-14 – 2015-05-15 (×2): 3 mL via RESPIRATORY_TRACT
  Filled 2015-05-13 (×2): qty 3

## 2015-05-13 MED ORDER — ALBUTEROL SULFATE (2.5 MG/3ML) 0.083% IN NEBU: 2.5000 mg | INHALATION_SOLUTION | RESPIRATORY_TRACT | Status: DC | PRN

## 2015-05-13 NOTE — Evaluation (Signed)
Clinical/Bedside Swallow Evaluation Patient Details  Name: [name] MRN: [id number] Date of Birth: May 25, 1941  Today's Date: 05/13/2015 Time: SLP Start Time (ACUTE ONLY): 1500 SLP Stop Time (ACUTE ONLY): 1513 SLP Time Calculation (min) (ACUTE ONLY): 13 min  Past Medical History:  Past Medical History  Diagnosis Date  . Depression   . Asthma   . GERD (gastroesophageal reflux disease)   . Hypertension     off all bp meds for last year  . Dysphagia     trouble swallowing since april 2015  . Arthritis   . Anemia   . History of blood transfusion 2015   Past Surgical History:  Past Surgical History  Procedure Laterality Date  . Tooth extraction    . Joint replacement Left 2010    knee  . Abdominal hysterectomy  age 14  . Eye surgery  age 64    for cross eyed  . Esophagogastroduodenoscopy (egd) with propofol N/A 03/28/2014    Procedure: ESOPHAGOGASTRODUODENOSCOPY (EGD) WITH PROPOFOL;  Surgeon: Charolett Bumpers, MD;  Location: [facility];  Service: Endoscopy;  Laterality: N/A;  . Balloon dilation N/A 03/28/2014    Procedure: BALLOON DILATION;  Surgeon: Charolett Bumpers, MD;  Location: [facility];  Service: Endoscopy;  Laterality: N/A;  . Colonoscopy with propofol N/A 03/28/2014    Procedure: COLONOSCOPY WITH PROPOFOL;  Surgeon: Charolett Bumpers, MD;  Location: [facility];  Service: Endoscopy;  Laterality: N/A;   HPI:  74 yo female with 5 days of sinus congestion, HA, fatigue, cough, wheeze, and dyspnea from asthma exacerbation. Pt had an endoscopy with dilatation in 2015. CXR has remained clear.   Assessment / Plan / Recommendation Clinical Impression  Pt report and presentation consistent with probable mild basline esophageal dysphagia. Pt reports occasional globus with pain in the mid chest with "heavy foods' and meats. No direct signs of aspiration observed. SLP provided education and compensatory strategies. Pt to f/u with GI if she desires medical intervention.      Aspiration Risk  Mild    Diet Recommendation Dysphagia 3 (Mech soft);Thin   Medication Administration: Whole meds with liquid Compensations: Follow solids with liquid;Slow rate;Small sips/bites    Other  Recommendations Recommended Consults: Consider GI evaluation Oral Care Recommendations: Oral care BID   Follow Up Recommendations       Frequency and Duration        Pertinent Vitals/Pain NA    SLP Swallow Goals     Swallow Study Prior Functional Status       General Other Pertinent Information: 74 yo female with 5 days of sinus congestion, HA, fatigue, cough, wheeze, and dyspnea from asthma exacerbation. Pt had an endoscopy with dilatation in 2015. CXR has remained clear. Type of Study: Bedside swallow evaluation Diet Prior to this Study: Regular;Thin liquids Temperature Spikes Noted: No Respiratory Status: Room air History of Recent Intubation: No Behavior/Cognition: Alert;Cooperative;Pleasant mood Oral Cavity - Dentition: Missing dentition Self-Feeding Abilities: Able to feed self Patient Positioning: Upright in bed Baseline Vocal Quality: Normal Volitional Cough: Strong;Congested Volitional Swallow: Able to elicit    Oral/Motor/Sensory Function Overall Oral Motor/Sensory Function: Appears within functional limits for tasks assessed   Ice Chips     Thin Liquid Thin Liquid: Within functional limits Presentation: Cup;Self Fed    Nectar Thick Nectar Thick Liquid: Not tested   Honey Thick Honey Thick Liquid: Not tested   Puree Puree: Not tested   Solid   GO    Solid: Not tested  [name], MA CCC-SLP [phone]  Claudine Mouton 05/13/2015,3:13 PM

## 2015-05-13 NOTE — Progress Notes (Signed)
PCCM PROGRESS NOTE  ADMISSION DATE: 05/11/2015 CONSULT DATE: 05/11/2015 REFERRING PROVIDER: [facility]  CC: Short of breath  SUBJECTIVE: Off oxygen.  Walked in room yesterday.  Sinuses better.  Still having cough and wheeze, but feels like air is getting into her lungs better.  OBJECTIVE: Temp:  [97.6 F (36.4 C)-98.4 F (36.9 C)] 98.3 F (36.8 C) (08/23 0420) Pulse Rate:  [85-103] 85 (08/23 0420) Resp:  [18-24] 20 (08/22 1236) BP: (128-166)/(75-83) 128/83 mmHg (08/23 0420) SpO2:  [93 %-100 %] 97 % (08/23 0420)   General: pleasant HEENT: no sinus tenderness Cardiac: regular Chest: b/l expiratory wheeze, better air movement Abd: soft, non tender Ext: no edema Neuro: normal strength Skin: no rashes   CMP Latest Ref Rng 05/13/2015 05/12/2015 05/11/2015  Glucose 65 - 99 mg/dL 409(W) 119(J) 478(G)  BUN 6 - 20 mg/dL 95(A) 17 14  Creatinine 0.44 - 1.00 mg/dL 2.13(Y) 8.65(H) 8.46(N)  Sodium 135 - 145 mmol/L 138 140 144  Potassium 3.5 - 5.1 mmol/L 4.0 3.9 3.5  Chloride 101 - 111 mmol/L 107 107 110  CO2 22 - 32 mmol/L Calcium 8.9 - 10.3 mg/dL 9.0 6.2(X) 5.2(W)  Total Protein 6.0 - 8.3 g/dL - - -  Total Bilirubin 0.3 - 1.2 mg/dL - - -  Alkaline Phos 39 - 117 U/L - - -  AST 0 - 37 U/L - - -  ALT 0 - 35 U/L - - -     CBC Latest Ref Rng 05/12/2015 05/11/2015 07/30/2014  WBC 4.0 - 10.5 K/uL 10.0 6.8 7.1  Hemoglobin 12.0 - 15.0 g/dL 11.4(L) 11.7(L) 12.1  Hematocrit 36.0 - 46.0 % 33.8(L) 35.2(L) 35.1(L)  Platelets 150 - 400 K/uL 251 239 229     Dg Chest 2 View  05/11/2015   CLINICAL DATA:  Cough. Asthma. Short of breath for 1 week. Initial encounter.  EXAM: CHEST  2 VIEW  COMPARISON:  07/30/2014.  02/22/2008.  FINDINGS: Heart size within normal limits for projection. Chronic tortuosity of the thoracic aorta with arch atherosclerosis. Chronic pleural thickening/ scarring. No airspace disease. No pleural effusion.  IMPRESSION: Chronic changes of the chest without acute  cardiopulmonary disease.   Electronically Signed   By: Andreas Newport M.D.   On: 05/11/2015 10:12    EVENTS: 8/21 Admit  DISCUSSION: 74 yo female with 5 days of sinus congestion, HA, fatigue, cough, wheeze, and dyspnea from asthma exacerbation.  ASSESSMENT/PLAN:  Acute hypoxic respiratory failure 2nd to acute asthma exacerbation with acute rhinitis. Plan: - day 3/5 of levaquin - change solumedrol to 40 mg q8h on 8/23 - continue singulair - continue nasal saline sprays, claritin, flonase - continue brovana/pulmicort with albuterol - adjust oxygen to keep SpO2 > 92% - she will need outpt pulmonary follow up  ?of recent upper airway procedure. Plan: - hospitalist team to locate outpt medical records   Coralyn Helling, MD [facility] 05/13/2015, 7:37 AM Pager:  [phone] After 3pm call: [phone]

## 2015-05-13 NOTE — Progress Notes (Signed)
[facility] PROGRESS NOTE   [name]:[id number] DOB: [birth year] DOA: 05/11/2015 PCP: Lillia Mountain, MD  HPI/Subjective Complaining about shortness of breath and wheezing, DOE.  Assessment/Plan: Principal Problem:   Acute respiratory failure Active Problems:   Asthma exacerbation   Status asthmaticus   AKI (acute kidney injury)   Acute rhinitis   Acute asthmatic exacerbation Patient having a history of asthma presenting with worsening shortness of breath associate with wheezing. Initial workup included a chest x-ray that did not reveal acute infiltrate.  Will treat asthmatic exacerbation with IV steroids, Solu-Medrol 60 mg IV every 6 hours along with scheduled albuterol nebulizer treatments and inhaled steroids therapy. Continue IV steroids, broncho-dilators, mucolytics and oxygen as needed.   Acute hypoxemic respiratory failure Evidence by respiratory rate of 28, O2 sat of 87%, requiring 5 L of oxygen in the emergency department, presenting with acute respiratory distress.  Symptoms secondary to acute asthmatic exacerbation and will treat with IV steroids, nebulizer treatments. Initially admitted to stepdown, status change to telemetry. PCCM following  Acute kidney injury Last creatinine from November 2015 was 0.9, admitted with creatinine of 1.29, creatinine today is 1.52. Started on IV fluids, check BMP in a.m.  Acute rhinitis Patient started on levofloxacin.  Code Status: Full Code Family Communication: Plan discussed with the patient. Disposition Plan: Remains inpatient Diet: DIET SOFT Room service appropriate?: Yes; Fluid consistency:: Thin  Consultants:  PCCM  Procedures:  None  Antibiotics:  Levaquin  Objective: Filed Vitals:   05/13/15 1342  BP: 129/93  Pulse:   Temp: 98.1 F (36.7 C)  Resp: 22    Intake/Output Summary (Last 24 hours) at 05/13/15 1443 Last data filed at 05/13/15 0804  Gross per 24 hour  Intake     480 ml  Output      0 ml  Net    480 ml   Filed Weights   05/11/15 0913 05/12/15 0415  Weight: 79.379 kg (175 lb) 76.975 kg (169 lb 11.2 oz)    Exam: General: Alert and awake, oriented x3, not in any acute distress. HEENT: anicteric sclera, pupils reactive to light and accommodation, EOMI CVS: S1-S2 clear, no murmur rubs or gallops Chest: clear to auscultation bilaterally, no wheezing, rales or rhonchi Abdomen: soft nontender, nondistended, normal bowel sounds, no organomegaly Extremities: no cyanosis, clubbing or edema noted bilaterally Neuro: Cranial nerves II-XII intact, no focal neurological deficits  Data Reviewed: Basic Metabolic Panel:  Recent Labs Lab 05/11/15 0932 05/12/15 0407 05/13/15 0556  NA 144 140 138  K 3.5 3.9 4.0  CL 110 107 107  CO2 27 23 24   GLUCOSE 101* 169* 166*  BUN 14 17 27*  CREATININE 1.18* 1.29* 1.52*  CALCIUM 8.8* 8.8* 9.0   Liver Function Tests: No results for input(s): AST, ALT, ALKPHOS, BILITOT, PROT, ALBUMIN in the last 168 hours. No results for input(s): LIPASE, AMYLASE in the last 168 hours. No results for input(s): AMMONIA in the last 168 hours. CBC:  Recent Labs Lab 05/11/15 0932 05/12/15 0407  WBC 6.8 10.0  NEUTROABS 3.6  --   HGB 11.7* 11.4*  HCT 35.2* 33.8*  MCV 87.6 86.2  PLT 239 251   Cardiac Enzymes: No results for input(s): CKTOTAL, CKMB, CKMBINDEX, TROPONINI in the last 168 hours. BNP (last 3 results)  Recent Labs  05/11/15 0932  BNP 14.0    ProBNP (last 3 results) No results for input(s): PROBNP in the last 8760 hours.  CBG:  Recent Labs Lab 05/12/15 1707  GLUCAP  151*    Micro Recent Results (from the past 240 hour(s))  MRSA PCR Screening     Status: None   Collection Time: 05/11/15  6:24 PM  Result Value Ref Range Status   MRSA by PCR NEGATIVE NEGATIVE Final    Comment:        The GeneXpert MRSA Assay (FDA approved for NASAL specimens only), is one component of a comprehensive MRSA  colonization surveillance program. It is not intended to diagnose MRSA infection nor to guide or monitor treatment for MRSA infections.      Studies: No results found.  Scheduled Meds: . antiseptic oral rinse  7 mL Mouth Rinse BID  . arformoterol  15 mcg Nebulization BID  . budesonide (PULMICORT) nebulizer solution  0.5 mg Nebulization BID  . buPROPion  300 mg Oral Daily  . DULoxetine  60 mg Oral Daily  . enoxaparin (LOVENOX) injection  40 mg Subcutaneous Q24H  . feeding supplement (ENSURE ENLIVE)  237 mL Oral BID BM  . fluticasone  2 spray Each Nare Daily  . ipratropium-albuterol  3 mL Nebulization Q6H  . levofloxacin  750 mg Oral Q48H  . loratadine  10 mg Oral Daily  . methylPREDNISolone (SOLU-MEDROL) injection  40 mg Intravenous 3 times per day  . montelukast  10 mg Oral QHS  . sodium chloride  2 spray Each Nare BID  . sodium chloride  3 mL Intravenous Q12H   Continuous Infusions: . sodium chloride 1,000 mL (05/13/15 0952)       Time spent: 35 minutes    [facility] A  [facility] Pager [phone] If 7PM-7AM, please contact night-coverage at [email or web address], password [facility] 05/13/2015, 2:43 PM  LOS: 2 days

## 2015-05-14 DIAGNOSIS — J9601 Acute respiratory failure with hypoxia: Principal | ICD-10-CM

## 2015-05-14 DIAGNOSIS — J45901 Unspecified asthma with (acute) exacerbation: Secondary | ICD-10-CM

## 2015-05-14 DIAGNOSIS — N179 Acute kidney failure, unspecified: Secondary | ICD-10-CM

## 2015-05-14 DIAGNOSIS — R05 Cough: Secondary | ICD-10-CM

## 2015-05-14 LAB — BASIC METABOLIC PANEL
Anion gap: 9 (ref 5–15)
BUN: 27 mg/dL — ABNORMAL HIGH (ref 6–20)
CALCIUM: 8.3 mg/dL — AB (ref 8.9–10.3)
CO2: 23 mmol/L (ref 22–32)
CREATININE: 1.38 mg/dL — AB (ref 0.44–1.00)
Chloride: 106 mmol/L (ref 101–111)
GFR calc Af Amer: 42 mL/min — ABNORMAL LOW (ref >60)
GFR calc non Af Amer: 36 mL/min — ABNORMAL LOW (ref >60)
GLUCOSE: 142 mg/dL — AB (ref 65–99)
Potassium: 4.1 mmol/L (ref 3.5–5.1)
Sodium: 138 mmol/L (ref 135–145)

## 2015-05-14 MED ORDER — PANTOPRAZOLE SODIUM 40 MG PO TBEC
40.0000 mg | DELAYED_RELEASE_TABLET | Freq: Every day | ORAL | Status: DC
Start: 2015-05-14 — End: 2015-05-16
  Administered 2015-05-14 – 2015-05-16 (×3): 40 mg via ORAL
  Filled 2015-05-14 (×3): qty 1

## 2015-05-14 MED ORDER — SODIUM CHLORIDE 0.9 % IV SOLN: INTRAVENOUS | Status: DC | PRN

## 2015-05-14 MED ORDER — IPRATROPIUM-ALBUTEROL 0.5-2.5 (3) MG/3ML IN SOLN
3.0000 mL | Freq: Four times a day (QID) | RESPIRATORY_TRACT | Status: DC
  Administered 2015-05-14 – 2015-05-16 (×8): 3 mL via RESPIRATORY_TRACT
  Filled 2015-05-14 (×8): qty 3

## 2015-05-14 MED ORDER — DM-GUAIFENESIN ER 30-600 MG PO TB12
1.0000 | ORAL_TABLET | Freq: Two times a day (BID) | ORAL | Status: DC
  Administered 2015-05-14 – 2015-05-16 (×5): 1 via ORAL
  Filled 2015-05-14 (×5): qty 1

## 2015-05-14 NOTE — Progress Notes (Signed)
[facility] PROGRESS NOTE  [name]:[id number] DOB: [birth year] DOA: 05/11/2015 PCP: Renee Mountain, MD  Brief Summary  [name] is a 74 y.o. female of a past medical history of asthma, major depression, also arthritis, presented to the emergency department with complaints of shortness of breath associate with wheezing that had gotten progressively worse for the week prior to admission.  She did not get relief with her inhalers and nebulizer so she presented to the ER where she was found to be in acute hypoxemic respiratory failure with extensive wheezing. She was given nebulizer treatments along with IV steroids.     Assessment/Plan  Acute hypoxemic respiratory failure secondary to acute asthma exacerbation, improving  -  Agree with tapering IV steroids -  Continue nebulizer treatments -  Continue brovana and budesonide -  Appreciate pulmonology assistance -  Levofloxacin day 4 of 5 -  Tele:  NSR, with a couple beats of tachycardia likely related to breathing treatment.  okay to d/c telemetry  GERD with hx of esophageal stricture -  Started PPI on 8/24  Dysphagia, mild -  Continue dysphagia 3 diet with thin liquids  Acute kidney injury, creatinine baseline 0.9 and peak of 1.52 on 8/23 -  Encourage PO  -  D/c IVF  Acute rhinitis - continue flonase  Deconditioned from acute illness -  PT/OT consults  Diet:  Dysphagia 3 Access:  PIV IVF:  off Proph:  lovenox   Code Status: Full  Family Communication: patient alone Disposition Plan: pending improvement in breathing   Consultants:  Pulmonology  Procedures:  none  Antibiotics:  Levofloxacin day 4 of 5   HPI/Subjective:  Starting to feel a little better.  Breathing more easily.  Still very SOB with eating and using bedside commode.      Objective: Filed Vitals:   05/13/15 2122 05/14/15 0528 05/14/15 0723 05/14/15 0728  BP:  140/88    Pulse:  84    Temp:  98.2 F  (36.8 C)    TempSrc:  Oral    Resp:  20    Height:      Weight:  80.786 kg (178 lb 1.6 oz)    SpO2: 97% 100% 91% 91%    Intake/Output Summary (Last 24 hours) at 05/14/15 1241 Last data filed at 05/13/15 1759  Gross per 24 hour  Intake    240 ml  Output      0 ml  Net    240 ml   Filed Weights   05/11/15 0913 05/12/15 0415 05/14/15 0528  Weight: 79.379 kg (175 lb) 76.975 kg (169 lb 11.2 oz) 80.786 kg (178 lb 1.6 oz)   Body mass index is 32.57 kg/(m^2).  Exam:   General:  Adult female, No acute distress, nasal canula in place  HEENT:  NCAT, MMM  Cardiovascular:  RRR, nl S1, S2 no mrg, 2+ pulses, warm extremities  Respiratory:  Full expiratory wheeze, diminished at bilateral bases, no rhonchi or rales, no increased WOB  Abdomen:   NABS, soft, NT/ND  MSK:   Normal tone and bulk, no LEE  Neuro:  Grossly intact  Data Reviewed: Basic Metabolic Panel:  Recent Labs Lab 05/11/15 0932 05/12/15 0407 05/13/15 0556 05/14/15 0416  NA 144 140 138 138  K 3.5 3.9 4.0 4.1  CL 110 107 107 106  CO2 27 23 24 23   GLUCOSE 101* 169* 166* 142*  BUN 14 17 27* 27*  CREATININE 1.18* 1.29* 1.52* 1.38*  CALCIUM 8.8* 8.8*  9.0 8.3*   Liver Function Tests: No results for input(s): AST, ALT, ALKPHOS, BILITOT, PROT, ALBUMIN in the last 168 hours. No results for input(s): LIPASE, AMYLASE in the last 168 hours. No results for input(s): AMMONIA in the last 168 hours. CBC:  Recent Labs Lab 05/11/15 0932 05/12/15 0407  WBC 6.8 10.0  NEUTROABS 3.6  --   HGB 11.7* 11.4*  HCT 35.2* 33.8*  MCV 87.6 86.2  PLT 239 251    Recent Results (from the past 240 hour(s))  MRSA PCR Screening     Status: None   Collection Time: 05/11/15  6:24 PM  Result Value Ref Range Status   MRSA by PCR NEGATIVE NEGATIVE Final    Comment:        The GeneXpert MRSA Assay (FDA approved for NASAL specimens only), is one component of a comprehensive MRSA colonization surveillance program. It is  not intended to diagnose MRSA infection nor to guide or monitor treatment for MRSA infections.      Studies: No results found.  Scheduled Meds: . antiseptic oral rinse  7 mL Mouth Rinse BID  . arformoterol  15 mcg Nebulization BID  . budesonide (PULMICORT) nebulizer solution  0.5 mg Nebulization BID  . buPROPion  300 mg Oral Daily  . DULoxetine  60 mg Oral Daily  . enoxaparin (LOVENOX) injection  40 mg Subcutaneous Q24H  . feeding supplement (ENSURE ENLIVE)  237 mL Oral BID BM  . fluticasone  2 spray Each Nare Daily  . ipratropium-albuterol  3 mL Nebulization Q6H WA  . levofloxacin  750 mg Oral Q48H  . loratadine  10 mg Oral Daily  . methylPREDNISolone (SOLU-MEDROL) injection  40 mg Intravenous 3 times per day  . montelukast  10 mg Oral QHS  . pantoprazole  40 mg Oral Q1200  . sodium chloride  2 spray Each Nare BID  . sodium chloride  3 mL Intravenous Q12H   Continuous Infusions:   Principal Problem:   Acute respiratory failure Active Problems:   Asthma exacerbation   Status asthmaticus   AKI (acute kidney injury)   Acute rhinitis    Time spent: 30 min    Andy Moye, [facility]  [facility] Pager [phone]. If 7PM-7AM, please contact night-coverage at [email or web address], password [facility] 05/14/2015, 12:41 PM  LOS: 3 days

## 2015-05-14 NOTE — Progress Notes (Signed)
PCCM PROGRESS NOTE  ADMISSION DATE: 05/11/2015 CONSULT DATE: 05/11/2015 REFERRING PROVIDER: [facility]  CC: Short of breath  SUBJECTIVE: Feels like she has something stuck in her throat.  This happens frequently.  She has hx of GERD and ?had esophageal dilation about a year ago.  Wheezing better.  Sinuses better.  OBJECTIVE: Temp:  [98.1 F (36.7 C)-98.2 F (36.8 C)] 98.2 F (36.8 C) (08/24 0528) Pulse Rate:  [84-87] 84 (08/24 0528) Resp:  [20-22] 20 (08/24 0528) BP: (129-156)/(88-99) 140/88 mmHg (08/24 0528) SpO2:  [91 %-100 %] 91 % (08/24 0728) Weight:  [178 lb 1.6 oz (80.786 kg)] 178 lb 1.6 oz (80.786 kg) (08/24 0528)   General: pleasant HEENT: no sinus tenderness, raspy voice Cardiac: regular Chest: faint b/l wheezing Abd: soft, non tender Ext: no edema Neuro: normal strength Skin: no rashes   CMP Latest Ref Rng 05/14/2015 05/13/2015 05/12/2015  Glucose 65 - 99 mg/dL 161(W) 960(A) 540(J)  BUN 6 - 20 mg/dL 81(X) 91(Y) 17  Creatinine 0.44 - 1.00 mg/dL 7.82(N) 5.62(Z) 3.08(M)  Sodium 135 - 145 mmol/L 138 138 140  Potassium 3.5 - 5.1 mmol/L 4.1 4.0 3.9  Chloride 101 - 111 mmol/L 106 107 107  CO2 22 - 32 mmol/L Calcium 8.9 - 10.3 mg/dL 8.3(L) 9.0 8.8(L)  Total Protein 6.0 - 8.3 g/dL - - -  Total Bilirubin 0.3 - 1.2 mg/dL - - -  Alkaline Phos 39 - 117 U/L - - -  AST 0 - 37 U/L - - -  ALT 0 - 35 U/L - - -     CBC Latest Ref Rng 05/12/2015 05/11/2015 07/30/2014  WBC 4.0 - 10.5 K/uL 10.0 6.8 7.1  Hemoglobin 12.0 - 15.0 g/dL 11.4(L) 11.7(L) 12.1  Hematocrit 36.0 - 46.0 % 33.8(L) 35.2(L) 35.1(L)  Platelets 150 - 400 K/uL 251 239 229     No results found.  EVENTS: 8/21 Admit 8/23 Speech evaluation  DISCUSSION: 74 yo female with 5 days of sinus congestion, HA, fatigue, cough, wheeze, and dyspnea from asthma exacerbation.  She has hx of post-nasal drip, and reflux also.  ASSESSMENT/PLAN:  Acute hypoxic respiratory failure 2nd to acute asthma exacerbation with  acute rhinitis. Plan: - day 4/5 of levaquin - changed solumedrol to 40 mg q8h on 8/23 >> if she continues to improve, then consider changing to prednisone soon - continue singulair - continue brovana/pulmicort with albuterol - adjust oxygen to keep SpO2 > 92% - she will need outpt pulmonary follow up  Upper airway cough syndrome related to post-nasal drip and reflux. Plan: - continue nasal saline spray, claritin, flonase - add protonix 8/24  Dysphagia. Plan: - D3 diet - f/u with speech therapy    Coralyn Helling, MD [facility] Pulmonary/Critical Care 05/14/2015, 9:05 AM Pager:  [phone] After 3pm call: [phone]

## 2015-05-15 LAB — BASIC METABOLIC PANEL
Anion gap: 8 (ref 5–15)
BUN: 22 mg/dL — AB (ref 6–20)
CALCIUM: 8.6 mg/dL — AB (ref 8.9–10.3)
CO2: 26 mmol/L (ref 22–32)
CREATININE: 1.16 mg/dL — AB (ref 0.44–1.00)
Chloride: 106 mmol/L (ref 101–111)
GFR calc Af Amer: 52 mL/min — ABNORMAL LOW (ref >60)
GFR calc non Af Amer: 45 mL/min — ABNORMAL LOW (ref >60)
GLUCOSE: 166 mg/dL — AB (ref 65–99)
Potassium: 3.5 mmol/L (ref 3.5–5.1)
Sodium: 140 mmol/L (ref 135–145)

## 2015-05-15 LAB — CBC
HCT: 31.9 % — ABNORMAL LOW (ref 36.0–46.0)
HEMOGLOBIN: 10.6 g/dL — AB (ref 12.0–15.0)
MCH: 28.6 pg (ref 26.0–34.0)
MCHC: 33.2 g/dL (ref 30.0–36.0)
MCV: 86.2 fL (ref 78.0–100.0)
PLATELETS: 246 10*3/uL (ref 150–400)
RBC: 3.7 MIL/uL — ABNORMAL LOW (ref 3.87–5.11)
RDW: 14.5 % (ref 11.5–15.5)
WBC: 8.5 10*3/uL (ref 4.0–10.5)

## 2015-05-15 MED ORDER — PREDNISONE 20 MG PO TABS
40.0000 mg | ORAL_TABLET | Freq: Every day | ORAL | Status: DC
  Administered 2015-05-15 – 2015-05-16 (×2): 40 mg via ORAL
  Filled 2015-05-15 (×2): qty 2

## 2015-05-15 MED ORDER — HYDRALAZINE HCL 20 MG/ML IJ SOLN: 10.0000 mg | INTRAMUSCULAR | Status: DC | PRN

## 2015-05-15 MED ORDER — INFLUENZA VAC SPLIT QUAD 0.5 ML IM SUSY
0.5000 mL | PREFILLED_SYRINGE | INTRAMUSCULAR | Status: AC
  Administered 2015-05-16: 0.5 mL via INTRAMUSCULAR
  Filled 2015-05-15: qty 0.5

## 2015-05-15 NOTE — Progress Notes (Signed)
PCCM PROGRESS NOTE  ADMISSION DATE: 05/11/2015 CONSULT DATE: 05/11/2015 REFERRING PROVIDER: [facility]  CC: Short of breath  SUBJECTIVE: Episode of chest pain last night >> better after maalox and tramadol.  Denies chest pain now.  Still has cough and chest congestion >> feels like mucinex helps.  OBJECTIVE: Temp:  [98.2 F (36.8 C)-98.7 F (37.1 C)] 98.2 F (36.8 C) (08/25 0428) Pulse Rate:  [81-94] 81 (08/25 0428) Resp:  [20] 20 (08/24 1342) BP: (134-159)/(82-98) 159/98 mmHg (08/25 0428) SpO2:  [96 %-100 %] 98 % (08/25 0722) Weight:  [186 lb (84.369 kg)] 186 lb (84.369 kg) (08/25 0428)   General: pleasant HEENT: no sinus tenderness, raspy voice Cardiac: regular Chest: no wheeze Abd: soft, non tender Ext: no edema Neuro: normal strength Skin: no rashes   CMP Latest Ref Rng 05/14/2015 05/13/2015 05/12/2015  Glucose 65 - 99 mg/dL 161(W) 960(A) 540(J)  BUN 6 - 20 mg/dL 81(X) 91(Y) 17  Creatinine 0.44 - 1.00 mg/dL 7.82(N) 5.62(Z) 3.08(M)  Sodium 135 - 145 mmol/L 138 138 140  Potassium 3.5 - 5.1 mmol/L 4.1 4.0 3.9  Chloride 101 - 111 mmol/L 106 107 107  CO2 22 - 32 mmol/L Calcium 8.9 - 10.3 mg/dL 8.3(L) 9.0 8.8(L)  Total Protein 6.0 - 8.3 g/dL - - -  Total Bilirubin 0.3 - 1.2 mg/dL - - -  Alkaline Phos 39 - 117 U/L - - -  AST 0 - 37 U/L - - -  ALT 0 - 35 U/L - - -     CBC Latest Ref Rng 05/12/2015 05/11/2015 07/30/2014  WBC 4.0 - 10.5 K/uL 10.0 6.8 7.1  Hemoglobin 12.0 - 15.0 g/dL 11.4(L) 11.7(L) 12.1  Hematocrit 36.0 - 46.0 % 33.8(L) 35.2(L) 35.1(L)  Platelets 150 - 400 K/uL 251 239 229     No results found.  EVENTS: 8/21 Admit 8/23 Speech evaluation  DISCUSSION: 74 yo female with 5 days of sinus congestion, HA, fatigue, cough, wheeze, and dyspnea from asthma exacerbation.  She has hx of post-nasal drip, and reflux also.  ASSESSMENT/PLAN:  Acute hypoxic respiratory failure 2nd to acute asthma exacerbation with acute rhinitis. Plan: - day 5/5 of  levaquin >> d/c after dose on 8/25 - change to prednisone 40 mg daily on 8/25 - continue singulair - continue brovana/pulmicort with albuterol - adjust oxygen to keep SpO2 > 92% - she will need outpt pulmonary follow up  Upper airway cough syndrome related to post-nasal drip and reflux. Plan: - continue nasal saline spray, claritin, flonase - added protonix 8/24  Dysphagia. Plan: - D3 diet - f/u with speech therapy  Summary: She is slowly improving.  She feels like she might be ready for d/c home on 8/26 >> will see how she does with transition to oral prednisone.  Coralyn Helling, MD [facility] Pulmonary/Critical Care 05/15/2015, 7:58 AM Pager:  [phone] After 3pm call: [phone]

## 2015-05-15 NOTE — Evaluation (Signed)
Physical Therapy Evaluation Patient Details Name: [name] MRN: [id number] DOB: [birth year] Today's Date: 05/15/2015   History of Present Illness  74 y.o. female admitted to [facility] on 05/11/15 for SOB.  Pt dx with acute hypoxic respiratory failure seondary to acute asthma exacerbation with acute rhinitis, upper airway cough syndrome related to post nasal drip and reflux, dysphasia-since April 2015 (on D3 diet per SLP).  Pt with significant PMHx of depression, HTN, anemia, and L TKA.  Clinical Impression  O2 remained stable on RA during gait despite 2/4 DOE.  Pt close to her baseline of physical function (still not yet respiratory function or endurance).  Encouraged her to walk several times per day while here in the hospital.  Pt will likely do well enough to return home without PT f/u at discharge.      Follow Up Recommendations No PT follow up    Equipment Recommendations  None recommended by PT    Recommendations for Other Services   NA    Precautions / Restrictions Precautions Precautions: Other (comment) Precaution Comments: monitor O2 sats with activity      Mobility  Bed Mobility               General bed mobility comments: pt seated EOB  Transfers Overall transfer level: Needs assistance Equipment used: None Transfers: Sit to/from Stand Sit to Stand: Supervision         General transfer comment: supervision for safety  Ambulation/Gait Ambulation/Gait assistance: Min guard Ambulation Distance (Feet): 80 Feet Assistive device:  (hallway railing at times) Gait Pattern/deviations: Step-through pattern;Staggering left;Staggering right Gait velocity: decreased Gait velocity interpretation: Below normal speed for age/gender General Gait Details: mildly staggering gait pattern, but has not really been up since 05/10/13 when she was admitted.  Staggering improved with increased gait distance.          Balance Overall balance assessment: Needs  assistance Sitting-balance support: Feet supported;No upper extremity supported Sitting balance-Leahy Scale: Good     Standing balance support: Single extremity supported Standing balance-Leahy Scale: Fair                               Pertinent Vitals/Pain Pain Assessment: No/denies pain    Home Living Family/patient expects to be discharged to:: Private residence Living Arrangements: Alone   Type of Home: Apartment Home Access: Level entry     Home Layout: One level Home Equipment: Walker - 2 wheels;Cane - single point      Prior Function Level of Independence: Independent         Comments: uses RW and cane at times when she doesn't feel good or when she is doing community ambulating.         Extremity/Trunk Assessment   Upper Extremity Assessment: Defer to OT evaluation           Lower Extremity Assessment: Generalized weakness (likely from being in the bed for a few days)      Cervical / Trunk Assessment: Normal  Communication   Communication: No difficulties  Cognition Arousal/Alertness: Awake/alert Behavior During Therapy: WFL for tasks assessed/performed Overall Cognitive Status: Within Functional Limits for tasks assessed                               Assessment/Plan    PT Assessment Patient needs continued PT services  PT Diagnosis Difficulty walking;Abnormality of gait;Generalized weakness  PT Problem List Decreased strength;Decreased activity tolerance;Decreased balance;Decreased mobility;Decreased knowledge of use of DME;Cardiopulmonary status limiting activity  PT Treatment Interventions DME instruction;Gait training;Functional mobility training;Therapeutic activities;Therapeutic exercise;Balance training;Neuromuscular re-education;Patient/family education   PT Goals (Current goals can be found in the Care Plan section) Acute Rehab PT Goals Patient Stated Goal: to go home tomorrow PT Goal Formulation: With  patient Time For Goal Achievement: 05/29/15 Potential to Achieve Goals: Good    Frequency Min 3X/week    End of Session Equipment Utilized During Treatment: Gait belt Activity Tolerance: Patient tolerated treatment well Patient left: in chair;with call bell/phone within reach;with nursing/sitter in room (RN tech in room ) Nurse Communication: Mobility status         Time: 2440-1027 PT Time Calculation (min) (ACUTE ONLY): 13 min   Charges:   PT Evaluation $Initial PT Evaluation Tier I: 1 Procedure          Johnesha Acheampong B. Mina Carlisi, PT, DPT [phone]   05/15/2015, 1:32 PM

## 2015-05-15 NOTE — Progress Notes (Addendum)
Was called into pt room for complaints of 10/10 chest pain. Did an EKG, pt already on 2 L O2. After talking with the pt, the pt stated that she thought the pain was just gas. I gave pt a dose of Maalox and a Tramadol. Followed up with pt, about 30 mins after giving meds and pt was sleeping comfortably. Will continue to monitor  [name] pt around 0430 this morning and pt had no complaints of chest pain.

## 2015-05-15 NOTE — Evaluation (Signed)
Occupational Therapy Evaluation Patient Details Name: [name] MRN: [id number] DOB: May 15, 1941 Today's Date: 05/15/2015    History of Present Illness 74 y.o. female admitted to [facility] on 05/11/15 for SOB.  Pt dx with acute hypoxic respiratory failure seondary to acute asthma exacerbation with acute rhinitis, upper airway cough syndrome related to post nasal drip and reflux, dysphasia-since April 2015 (on D3 diet per SLP).  Pt with significant PMHx of depression, HTN, anemia, and L TKA.   Clinical Impression   Patient evaluated by Occupational Therapy with no further acute OT needs identified. All education has been completed and the patient has no further questions. Pt is able to perform ADLs modified independently, and has assist available PRN for IADLs.  She is very motivated.  She would benefit from shower seat and 3in1 commode.  See below for any follow-up Occupational Therapy or equipment needs. OT is signing off. Thank you for this referral.      Follow Up Recommendations  No OT follow up    Equipment Recommendations  3 in 1 bedside comode;Tub/shower seat    Recommendations for Other Services       Precautions / Restrictions Precautions Precautions: Other (comment)      Mobility Bed Mobility Overal bed mobility: Modified Independent                Transfers Overall transfer level: Modified independent                    Balance Overall balance assessment: No apparent balance deficits (not formally assessed) (Pt with no LOB during OT eval )                                          ADL Overall ADL's : Modified independent                                       General ADL Comments: Pt is able to perform ADLs mod I, but requires 2-3 rest breaks, but is able to independently monitor need for rest breaks and demonstrates good safety awareness.  She would benefit from shower seat and 3in1 commode       Vision      Perception     Praxis      Pertinent Vitals/Pain Pain Assessment: No/denies pain     Hand Dominance     Extremity/Trunk Assessment Upper Extremity Assessment Upper Extremity Assessment: Generalized weakness   Lower Extremity Assessment Lower Extremity Assessment: Defer to PT evaluation   Cervical / Trunk Assessment Cervical / Trunk Assessment: Normal   Communication Communication Communication: No difficulties   Cognition Arousal/Alertness: Awake/alert Behavior During Therapy: WFL for tasks assessed/performed Overall Cognitive Status: Within Functional Limits for tasks assessed                     General Comments       Exercises       Shoulder Instructions      Home Living Family/patient expects to be discharged to:: Private residence Living Arrangements: Alone   Type of Home: Apartment Home Access: Level entry     Home Layout: One level     Bathroom Shower/Tub: Producer, television/film/video: Standard     Home Equipment: Environmental consultant - 2 wheels;Cane -  single point   Additional Comments: reports family can assist her with grocery shopping and meals if needed       Prior Functioning/Environment Level of Independence: Independent        Comments: uses RW and cane at times when she doesn't feel good or when she is doing community ambulating.     OT Diagnosis: Generalized weakness   OT Problem List:     OT Treatment/Interventions:      OT Goals(Current goals can be found in the care plan section) Acute Rehab OT Goals Patient Stated Goal: to go home tomorrow  OT Frequency:     Barriers to D/C:            Co-evaluation              End of Session Nurse Communication: Mobility status  Activity Tolerance: Patient tolerated treatment well Patient left: in bed;with call bell/phone within reach   Time: 1434-1448 OT Time Calculation (min): 14 min Charges:  OT General Charges $OT Visit: 1 Procedure OT Evaluation $Initial OT  Evaluation Tier I: 1 Procedure G-Codes:    [name], [name] June 11, 2015, 4:05 PM

## 2015-05-15 NOTE — Progress Notes (Addendum)
[facility] PROGRESS NOTE  [name] UJW:[id number] DOB: [birth year] DOA: 05/11/2015 PCP: Lillia Mountain, MD  Brief Summary  [name] is a 74 y.o. female of a past medical history of asthma, major depression, also arthritis, presented to the emergency department with complaints of shortness of breath associate with wheezing that had gotten progressively worse for the week prior to admission.  She did not get relief with her inhalers and nebulizer so she presented to the ER where she was found to be in acute hypoxemic respiratory failure with extensive wheezing. She was given nebulizer treatments along with IV steroids.     Assessment/Plan  Acute hypoxemic respiratory failure secondary to acute asthma exacerbation, improving  -  Start oral prednisone today -  Continue nebulizer treatments -  Continue brovana and budesonide -  Continue singulair -  Appreciate pulmonology assistance -  Levofloxacin day 5 of 5 -  Tele:  NSR, okay to d/c telemetry -  Sleep study as outpatient  GERD with hx of esophageal stricture -  Started PPI on 8/24  Dysphagia, mild -  Continue dysphagia 3 diet with thin liquids  Acute kidney injury, creatinine baseline 0.9 and peak of 1.52 on 8/23, trending down -  Encourage PO   allergic rhinitis - continue flonase  Deconditioned from acute illness -  PT/OT consults:  No follow up.  3-in-1 BSC and tub/shower seat  Elevated blood pressures likely secondary to steroids -  Start prn hydralazine -  Will need repeat BP check as outpatient after completion of steroid taper.  Depression/anxiety, stable, continue bupropion, duloxetine, ativan  Diet:  Dysphagia 3 Access:  PIV IVF:  off Proph:  lovenox   Code Status: Full  Family Communication: patient alone Disposition Plan:  Anticipate discharge home tomorrow if breathing continues to improve on  prednisone  Consultants:  Pulmonology  Procedures:  none  Antibiotics:  Levofloxacin day 5 of 5   HPI/Subjective:  Feeling better.  Breathing more easily.  Less SOB with eating and able to walk to bathroom with oxygen on.   Objective: Filed Vitals:   05/15/15 1114 05/15/15 1337 05/15/15 1709 05/15/15 1714  BP:   174/98 154/94  Pulse:   104   Temp:   98.2 F (36.8 C)   TempSrc:   Oral   Resp:   20   Height:      Weight:      SpO2: 94% 94% 95%     Intake/Output Summary (Last 24 hours) at 05/15/15 1750 Last data filed at 05/15/15 1500  Gross per 24 hour  Intake    600 ml  Output      0 ml  Net    600 ml   Filed Weights   05/12/15 0415 05/14/15 0528 05/15/15 0428  Weight: 76.975 kg (169 lb 11.2 oz) 80.786 kg (178 lb 1.6 oz) 84.369 kg (186 lb)   Body mass index is 34.01 kg/(m^2).  Exam:   General:  Adult female, No acute distress, nasal canula in place  HEENT:  NCAT, MMM  Cardiovascular:  RRR, nl S1, S2 no mrg, 2+ pulses, warm extremities  Respiratory:  Full expiratory wheeze but better aeration and lower pitched wheeze today, no rhonchi or rales  Abdomen:   NABS, soft, NT/ND  MSK:   Normal tone and bulk, no LEE  Neuro:  Grossly intact  Data Reviewed: Basic Metabolic Panel:  Recent Labs Lab 05/11/15 0932 05/12/15 0407 05/13/15 0556 05/14/15 0416 05/15/15 0625  NA 144 140 138  138 140  K 3.5 3.9 4.0 4.1 3.5  CL 110 107 107 106 106  CO2 GLUCOSE 101* 169* 166* 142* 166*  BUN 14 17 27* 27* 22*  CREATININE 1.18* 1.29* 1.52* 1.38* 1.16*  CALCIUM 8.8* 8.8* 9.0 8.3* 8.6*   Liver Function Tests: No results for input(s): AST, ALT, ALKPHOS, BILITOT, PROT, ALBUMIN in the last 168 hours. No results for input(s): LIPASE, AMYLASE in the last 168 hours. No results for input(s): AMMONIA in the last 168 hours. CBC:  Recent Labs Lab 05/11/15 0932 05/12/15 0407 05/15/15 0625  WBC 6.8 10.0 8.5  NEUTROABS 3.6  --   --   HGB 11.7* 11.4*  10.6*  HCT 35.2* 33.8* 31.9*  MCV 87.6 86.2 86.2  PLT 239 251 246    Recent Results (from the past 240 hour(s))  MRSA PCR Screening     Status: None   Collection Time: 05/11/15  6:24 PM  Result Value Ref Range Status   MRSA by PCR NEGATIVE NEGATIVE Final    Comment:        The GeneXpert MRSA Assay (FDA approved for NASAL specimens only), is one component of a comprehensive MRSA colonization surveillance program. It is not intended to diagnose MRSA infection nor to guide or monitor treatment for MRSA infections.      Studies: No results found.  Scheduled Meds: . antiseptic oral rinse  7 mL Mouth Rinse BID  . arformoterol  15 mcg Nebulization BID  . budesonide (PULMICORT) nebulizer solution  0.5 mg Nebulization BID  . buPROPion  300 mg Oral Daily  . dextromethorphan-guaiFENesin  1 tablet Oral BID  . DULoxetine  60 mg Oral Daily  . enoxaparin (LOVENOX) injection  40 mg Subcutaneous Q24H  . feeding supplement (ENSURE ENLIVE)  237 mL Oral BID BM  . fluticasone  2 spray Each Nare Daily  . ipratropium-albuterol  3 mL Nebulization Q6H WA  . levofloxacin  750 mg Oral Q48H  . loratadine  10 mg Oral Daily  . montelukast  10 mg Oral QHS  . pantoprazole  40 mg Oral Q1200  . predniSONE  40 mg Oral Q breakfast  . sodium chloride  2 spray Each Nare BID  . sodium chloride  3 mL Intravenous Q12H   Continuous Infusions:   Principal Problem:   Acute respiratory failure Active Problems:   Asthma exacerbation   Status asthmaticus   AKI (acute kidney injury)   Acute rhinitis    Time spent: 30 min    Deanna Wiater  [facility] Pager [phone]. If 7PM-7AM, please contact night-coverage at [email or web address], password [facility] 05/15/2015, 5:50 PM  LOS: 4 days

## 2015-05-16 DIAGNOSIS — J4532 Mild persistent asthma with status asthmaticus: Secondary | ICD-10-CM

## 2015-05-16 DIAGNOSIS — J9601 Acute respiratory failure with hypoxia: Secondary | ICD-10-CM | POA: Diagnosis not present

## 2015-05-16 DIAGNOSIS — R030 Elevated blood-pressure reading, without diagnosis of hypertension: Secondary | ICD-10-CM

## 2015-05-16 DIAGNOSIS — K219 Gastro-esophageal reflux disease without esophagitis: Secondary | ICD-10-CM

## 2015-05-16 DIAGNOSIS — IMO0001 Reserved for inherently not codable concepts without codable children: Secondary | ICD-10-CM

## 2015-05-16 DIAGNOSIS — D649 Anemia, unspecified: Secondary | ICD-10-CM

## 2015-05-16 DIAGNOSIS — R1314 Dysphagia, pharyngoesophageal phase: Secondary | ICD-10-CM

## 2015-05-16 LAB — BASIC METABOLIC PANEL
ANION GAP: 5 (ref 5–15)
BUN: 21 mg/dL — AB (ref 6–20)
CALCIUM: 8.5 mg/dL — AB (ref 8.9–10.3)
CO2: 29 mmol/L (ref 22–32)
Chloride: 106 mmol/L (ref 101–111)
Creatinine, Ser: 1.32 mg/dL — ABNORMAL HIGH (ref 0.44–1.00)
GFR calc Af Amer: 45 mL/min — ABNORMAL LOW (ref >60)
GFR, EST NON AFRICAN AMERICAN: 38 mL/min — AB (ref >60)
Glucose, Bld: 89 mg/dL (ref 65–99)
POTASSIUM: 3.3 mmol/L — AB (ref 3.5–5.1)
SODIUM: 140 mmol/L (ref 135–145)

## 2015-05-16 MED ORDER — BUDESONIDE-FORMOTEROL FUMARATE 160-4.5 MCG/ACT IN AERO: 2.0000 | INHALATION_SPRAY | Freq: Two times a day (BID) | RESPIRATORY_TRACT | Status: AC

## 2015-05-16 MED ORDER — DM-GUAIFENESIN ER 30-600 MG PO TB12: 1.0000 | ORAL_TABLET | Freq: Two times a day (BID) | ORAL | Status: AC

## 2015-05-16 MED ORDER — POTASSIUM CHLORIDE CRYS ER 20 MEQ PO TBCR
40.0000 meq | EXTENDED_RELEASE_TABLET | Freq: Once | ORAL | Status: AC
  Administered 2015-05-16: 40 meq via ORAL
  Filled 2015-05-16: qty 2

## 2015-05-16 MED ORDER — SALINE SPRAY 0.65 % NA SOLN: 2.0000 | Freq: Two times a day (BID) | NASAL | Status: AC

## 2015-05-16 MED ORDER — PANTOPRAZOLE SODIUM 40 MG PO TBEC: 40.0000 mg | DELAYED_RELEASE_TABLET | Freq: Every day | ORAL | Status: AC

## 2015-05-16 MED ORDER — FLUTICASONE PROPIONATE 50 MCG/ACT NA SUSP: 2.0000 | Freq: Every day | NASAL | Status: AC

## 2015-05-16 MED ORDER — LORATADINE 10 MG PO TABS: 10.0000 mg | ORAL_TABLET | Freq: Every day | ORAL | Status: AC

## 2015-05-16 MED ORDER — PREDNISONE 20 MG PO TABS: ORAL_TABLET | ORAL | Status: AC

## 2015-05-16 MED ORDER — MOMETASONE FURO-FORMOTEROL FUM 100-5 MCG/ACT IN AERO
2.0000 | INHALATION_SPRAY | Freq: Two times a day (BID) | RESPIRATORY_TRACT | Status: DC
  Filled 2015-05-16: qty 8.8

## 2015-05-16 NOTE — Discharge Summary (Signed)
Physician Discharge Summary  [name] EAV:[id number] DOB: Jan 02, 1941 DOA: 05/11/2015  PCP: Lillia Mountain, MD  Admit date: 05/11/2015 Discharge date: 05/16/2015  Recommendations for Outpatient Follow-up:  1. F/u with Dr. Celene Skeen in 1 week for f/u of COPD vs. asthma exacerbation and rhinitis 2. F/u with PCP in 2 weeks for blood pressure check.   3. Prednisone taper.  Started symbicort, prn albuterol  Discharge Diagnoses:  Principal Problem:   Status asthmaticus Active Problems:   Asthma exacerbation   Acute respiratory failure   Chronic kidney disease, stage III (moderate)   Acute rhinitis   Elevated blood pressure   Dysphagia, pharyngoesophageal phase   GERD (gastroesophageal reflux disease)   Normocytic anemia   Discharge Condition: stable, improved  Diet recommendation: dysphagia 3  Wt Readings from Last 3 Encounters:  05/16/15 80.604 kg (177 lb 11.2 oz)  07/30/14 81.647 kg (180 lb)  03/28/14 81.647 kg (180 lb)    History of present illness:   [name] is a 74 y.o. female of a past medical history of asthma, major depression, also arthritis, presented to the emergency department with complaints of shortness of breath associate with wheezing that had gotten progressively worse for the week prior to admission. She did not get relief with her inhalers and nebulizer so she presented to the ER where she was found to be in acute hypoxemic respiratory failure with extensive wheezing. She was given nebulizer treatments along with IV steroids.    Hospital Course:   Acute hypoxemic respiratory failure secondary to acute asthma vs. COPD exacerbation likely triggered by rhinitis.  She was started on IV solumedrol, brovana and budesonide, duonebs, and levofloxacin.  She completed 5 days of antibiotics in the hospital.  Her breathing gradually improved and she was stable on room air and able to tolerate ADLs without significant shortness of breath or  hypoxia.  She did not require oxygen at discharge.  She should continue steroids in a taper over one week, symbicort (Dulera recommended by pulmonology but was not on her insurance formulary), singulair, and prn albuterol until she follows up with pulmonology as an outpatient.  Consider sleep study as outpatient.  PFTs per pulmonology as outpatient.    GERD with hx of esophageal stricture.  Started PPI on 8/24.    Dysphagia, mild.  Continued dysphagia 3 diet with thin liquids.    Chronic kidney disease stage III, creatinine remained near baseline of 1.3-1.5.    Allergic rhinitis, continued flonase, nasal saline.    Deconditioned from acute illness.  PT/OT consults: No follow up. 3-in-1 BSC and tub/shower seat ordered.    Elevated blood pressures likely secondary to steroids.  Start prn hydralazine during hospitalization but anticipate that.  Will need repeat BP check as outpatient after completion of steroid taper.    Depression/anxiety, stable, continued bupropion, duloxetine, ativan  Normocytic anemia, likely of chronic disease and renal disease.  Defer to PCP.    Consultants:  Pulmonology  Procedures:  none  Antibiotics:  Levofloxacin completed 5 days on 8/25  Discharge Exam: Filed Vitals:   05/16/15 0500  BP: 149/91  Pulse: 88  Temp: 98.4 F (36.9 C)  Resp:    Filed Vitals:   05/15/15 2100 05/16/15 0222 05/16/15 0500 05/16/15 0843  BP: 153/86  149/91   Pulse: 98  88   Temp: 97.8 F (36.6 C)  98.4 F (36.9 C)   TempSrc: Oral  Oral   Resp:      Height:  Weight:   80.604 kg (177 lb 11.2 oz)   SpO2: 96% 98% 96% 97%    General: Adult female, No acute distress, room air  HEENT: NCAT, MMM  Cardiovascular: RRR, nl S1, S2 no mrg, 2+ pulses, warm extremities  Respiratory: end-expiratory wheeze, diminished, no rhonchi or rales  Abdomen: NABS, soft, NT/ND  MSK: Normal tone and bulk, no LEE  Neuro: Grossly intact  Discharge Instructions       Discharge Instructions    Call MD for:  difficulty breathing, headache or visual disturbances    Complete by:  As directed      Call MD for:  extreme fatigue    Complete by:  As directed      Call MD for:  hives    Complete by:  As directed      Call MD for:  persistant dizziness or light-headedness    Complete by:  As directed      Call MD for:  persistant nausea and vomiting    Complete by:  As directed      Call MD for:  severe uncontrolled pain    Complete by:  As directed      Call MD for:  temperature >100.4    Complete by:  As directed      Diet - low sodium heart healthy    Complete by:  As directed      Discharge instructions    Complete by:  As directed   You were hospitalized with shortness of breath from COPD.  You completed a course of antibiotics while you were in the hospital.  You will probably still have wheezing and cough for several more weeks.  Please use prednisone in decreasing doses over the next 6 days.  Also, use symbicort every day to reduce wheezing and prevent COPD attacks.  Use albuterol as needed for shortness of breath.  You need to follow up with pulmonology in 1 week, even if you are starting to feel better.  If you start to feel worse, call their clinic right away and if you feel very [name] of breath, call 911 to return to the hospital.  For your nose, use flonase, nasal saline, singulair and claritin (loratadine).  You may not need to continue all of these medications forever, but for now, they will help open up your sinuses and help you breath better.     Increase activity slowly    Complete by:  As directed             Medication List    STOP taking these medications        PRESCRIPTION MEDICATION     temazepam 15 MG capsule  Commonly known as:  RESTORIL      TAKE these medications        budesonide-formoterol 160-4.5 MCG/ACT inhaler  Commonly known as:  SYMBICORT  Inhale 2 puffs into the lungs 2 (two) times daily.     buPROPion 300 MG  24 hr tablet  Commonly known as:  WELLBUTRIN XL  Take 300 mg by mouth daily.     dextromethorphan-guaiFENesin 30-600 MG per 12 hr tablet  Commonly known as:  MUCINEX DM  Take 1 tablet by mouth 2 (two) times daily.     DULoxetine 60 MG capsule  Commonly known as:  CYMBALTA  Take 60 mg by mouth daily.     fluticasone 50 MCG/ACT nasal spray  Commonly known as:  FLONASE  Place 2 sprays into both nostrils  daily.     loratadine 10 MG tablet  Commonly known as:  CLARITIN  Take 1 tablet (10 mg total) by mouth daily.     LORazepam 0.5 MG tablet  Commonly known as:  ATIVAN  Take 0.5 mg by mouth 2 (two) times daily as needed for anxiety.     montelukast 10 MG tablet  Commonly known as:  SINGULAIR  Take 10 mg by mouth at bedtime.     pantoprazole 40 MG tablet  Commonly known as:  PROTONIX  Take 1 tablet (40 mg total) by mouth daily at 12 noon.     predniSONE 20 MG tablet  Commonly known as:  DELTASONE  Take 2 tabs daily x 2 days, 1 tab daily x 2 days, half tab daily for 2 days, then top.     PROAIR HFA 108 (90 BASE) MCG/ACT inhaler  Generic drug:  albuterol  Inhale 2 puffs into the lungs every 4 (four) hours as needed.     sodium chloride 0.65 % Soln nasal spray  Commonly known as:  OCEAN  Place 2 sprays into both nostrils 2 (two) times daily.     traMADol 50 MG tablet  Commonly known as:  ULTRAM  Take 50 mg by mouth every 6 (six) hours as needed for pain.       Follow-up Information    Follow up with Lawanda Cousins, MD On 05/22/2015.   Specialty:  Pulmonary Disease   Why:  Follow up with lung doctor.  Appointment is at 2:30 pm.   Contact information:   [address] [place] [address] [phone]       Follow up with Lillia Mountain, MD. Schedule an appointment as soon as possible for a visit in 2 weeks.   Specialty:  Internal Medicine   Why:  As needed   Contact information:   [address] [place] [address] [phone]         The results of significant diagnostics from this hospitalization (including imaging, microbiology, ancillary and laboratory) are listed below for reference.    Significant Diagnostic Studies: Dg Chest 2 View  05/11/2015   CLINICAL DATA:  Cough. Asthma. Dagon Budai of breath for 1 week. Initial encounter.  EXAM: CHEST  2 VIEW  COMPARISON:  07/30/2014.  02/22/2008.  FINDINGS: Heart size within normal limits for projection. Chronic tortuosity of the thoracic aorta with arch atherosclerosis. Chronic pleural thickening/ scarring. No airspace disease. No pleural effusion.  IMPRESSION: Chronic changes of the chest without acute cardiopulmonary disease.   Electronically Signed   By: Andreas Newport M.D.   On: 05/11/2015 10:12    Microbiology: Recent Results (from the past 240 hour(s))  MRSA PCR Screening     Status: None   Collection Time: 05/11/15  6:24 PM  Result Value Ref Range Status   MRSA by PCR NEGATIVE NEGATIVE Final    Comment:        The GeneXpert MRSA Assay (FDA approved for NASAL specimens only), is one component of a comprehensive MRSA colonization surveillance program. It is not intended to diagnose MRSA infection nor to guide or monitor treatment for MRSA infections.      Labs: Basic Metabolic Panel:  Recent Labs Lab 05/12/15 0407 05/13/15 0556 05/14/15 0416 05/15/15 0625 05/16/15 0406  NA 140 138 138 140 140  K 3.9 4.0 4.1 3.5 3.3*  CL 107 107 106 106 106  CO2 23 24 23 26 29   GLUCOSE 169* 166* 142*  166* 89  BUN 17 27* 27* 22* 21*  CREATININE 1.29* 1.52* 1.38* 1.16* 1.32*  CALCIUM 8.8* 9.0 8.3* 8.6* 8.5*   Liver Function Tests: No results for input(s): AST, ALT, ALKPHOS, BILITOT, PROT, ALBUMIN in the last 168 hours. No results for input(s): LIPASE, AMYLASE in the last 168 hours. No results for input(s): AMMONIA in the last 168 hours. CBC:  Recent Labs Lab 05/11/15 0932 05/12/15 0407 05/15/15 0625  WBC 6.8 10.0 8.5  NEUTROABS 3.6  --   --   HGB 11.7*  11.4* 10.6*  HCT 35.2* 33.8* 31.9*  MCV 87.6 86.2 86.2  PLT 239 251 246   Cardiac Enzymes: No results for input(s): CKTOTAL, CKMB, CKMBINDEX, TROPONINI in the last 168 hours. BNP: BNP (last 3 results)  Recent Labs  05/11/15 0932  BNP 14.0    ProBNP (last 3 results) No results for input(s): PROBNP in the last 8760 hours.  CBG:  Recent Labs Lab 05/12/15 1707  GLUCAP 151*    Time coordinating discharge: 35 minutes  Signed:  Cailin Gebel  [facility] 05/16/2015, 12:17 PM

## 2015-05-16 NOTE — Care Management Important Message (Signed)
Important Message  Patient Details  Name: [name] MRN: [id number] Date of Birth: 13-Jun-1941   Medicare Important Message Given:  Yes-third notification given    Yvone Neu, RN 05/16/2015, 11:11 AM

## 2015-05-16 NOTE — Progress Notes (Signed)
PCCM PROGRESS NOTE  ADMISSION DATE: 05/11/2015 CONSULT DATE: 05/11/2015 REFERRING PROVIDER: [facility]  CC: Short of breath  SUBJECTIVE: Feels ready for discharge.  Still has cough, but not much sputum.  Wheezing better.  OBJECTIVE: Temp:  [97.8 F (36.6 C)-98.4 F (36.9 C)] 98.4 F (36.9 C) (08/26 0500) Pulse Rate:  [88-104] 88 (08/26 0500) Resp:  [20] 20 (08/25 1709) BP: (128-174)/(81-98) 149/91 mmHg (08/26 0500) SpO2:  [91 %-98 %] 97 % (08/26 0843) Weight:  [177 lb 11.2 oz (80.604 kg)] 177 lb 11.2 oz (80.604 kg) (08/26 0500)   General: pleasant HEENT: no sinus tenderness Cardiac: regular Chest: no wheeze Abd: soft, non tender Ext: no edema Neuro: normal strength Skin: no rashes   CMP Latest Ref Rng 05/16/2015 05/15/2015 05/14/2015  Glucose 65 - 99 mg/dL 89 161(W) 960(A)  BUN 6 - 20 mg/dL 54(U) 98(J) 19(J)  Creatinine 0.44 - 1.00 mg/dL 4.78(G) 9.56(O) 1.30(Q)  Sodium 135 - 145 mmol/L 140 140 138  Potassium 3.5 - 5.1 mmol/L 3.3(L) 3.5 4.1  Chloride 101 - 111 mmol/L 106 106 106  CO2 22 - 32 mmol/L Calcium 8.9 - 10.3 mg/dL 6.5(H) 8.4(O) 8.3(L)  Total Protein 6.0 - 8.3 g/dL - - -  Total Bilirubin 0.3 - 1.2 mg/dL - - -  Alkaline Phos 39 - 117 U/L - - -  AST 0 - 37 U/L - - -  ALT 0 - 35 U/L - - -     CBC Latest Ref Rng 05/15/2015 05/12/2015 05/11/2015  WBC 4.0 - 10.5 K/uL 8.5 10.0 6.8  Hemoglobin 12.0 - 15.0 g/dL 10.6(L) 11.4(L) 11.7(L)  Hematocrit 36.0 - 46.0 % 31.9(L) 33.8(L) 35.2(L)  Platelets 150 - 400 K/uL 246 251 239     No results found.  EVENTS: 8/21 Admit 8/23 Speech evaluation  ANTIBIOTICS: 8/21 Levaquin >> 8/25  DISCUSSION: 74 yo female with 5 days of sinus congestion, HA, fatigue, cough, wheeze, and dyspnea from asthma exacerbation.  She has hx of post-nasal drip, and reflux also.  ASSESSMENT/PLAN:  Acute hypoxic respiratory failure 2nd to acute asthma exacerbation with acute rhinitis. Plan: - completed Abx 8/25 - change to prednisone 40  mg daily on 8/25 >> wean off as tolerated over next 1 week - continue singulair - can transition to dulera as outpt  - continue prn albuterol  Upper airway cough syndrome related to post-nasal drip and reflux. Plan: - continue nasal saline spray, claritin, flonase, singulair - added protonix 8/24 >> continue until re-assessed as outpt  Dysphagia. Plan: - D3 diet - f/u with speech therapy  Summary: Okay for d/c home from pulmonary status.  I have scheduled her for pulmonary follow up visit with Dr. Lawanda Cousins on Thursday, September 1, at 2:30 PM.  Please call if additional help is needed while she is in hospital.  Coralyn Helling, MD [facility] Pulmonary/Critical Care 05/16/2015, 10:10 AM Pager:  [phone] After 3pm call: [phone]

## 2015-05-22 ENCOUNTER — Inpatient Hospital Stay: Payer: Medicare HMO | Admitting: Pulmonary Disease

## 2015-05-25 ENCOUNTER — Encounter (HOSPITAL_COMMUNITY): Payer: Self-pay

## 2015-05-25 ENCOUNTER — Emergency Department (HOSPITAL_COMMUNITY)
Admission: EM | Admit: 2015-05-25 | Discharge: 2015-06-21 | Disposition: E | Payer: Medicare HMO | Attending: Emergency Medicine | Admitting: Emergency Medicine

## 2015-05-25 DIAGNOSIS — Z862 Personal history of diseases of the blood and blood-forming organs and certain disorders involving the immune mechanism: Secondary | ICD-10-CM | POA: Diagnosis not present

## 2015-05-25 DIAGNOSIS — R61 Generalized hyperhidrosis: Secondary | ICD-10-CM | POA: Diagnosis not present

## 2015-05-25 DIAGNOSIS — M1990 Unspecified osteoarthritis, unspecified site: Secondary | ICD-10-CM | POA: Diagnosis not present

## 2015-05-25 DIAGNOSIS — Z7952 Long term (current) use of systemic steroids: Secondary | ICD-10-CM | POA: Insufficient documentation

## 2015-05-25 DIAGNOSIS — Z79899 Other long term (current) drug therapy: Secondary | ICD-10-CM | POA: Insufficient documentation

## 2015-05-25 DIAGNOSIS — I129 Hypertensive chronic kidney disease with stage 1 through stage 4 chronic kidney disease, or unspecified chronic kidney disease: Secondary | ICD-10-CM | POA: Diagnosis not present

## 2015-05-25 DIAGNOSIS — N183 Chronic kidney disease, stage 3 (moderate): Secondary | ICD-10-CM | POA: Diagnosis not present

## 2015-05-25 DIAGNOSIS — K219 Gastro-esophageal reflux disease without esophagitis: Secondary | ICD-10-CM | POA: Diagnosis not present

## 2015-05-25 DIAGNOSIS — F329 Major depressive disorder, single episode, unspecified: Secondary | ICD-10-CM | POA: Insufficient documentation

## 2015-05-25 DIAGNOSIS — Z7951 Long term (current) use of inhaled steroids: Secondary | ICD-10-CM | POA: Insufficient documentation

## 2015-05-25 DIAGNOSIS — R079 Chest pain, unspecified: Secondary | ICD-10-CM | POA: Diagnosis present

## 2015-05-25 DIAGNOSIS — J45901 Unspecified asthma with (acute) exacerbation: Secondary | ICD-10-CM | POA: Diagnosis not present

## 2015-05-25 DIAGNOSIS — I469 Cardiac arrest, cause unspecified: Secondary | ICD-10-CM | POA: Insufficient documentation

## 2015-05-25 MED ORDER — SODIUM CHLORIDE 0.9 % IV SOLN
INTRAVENOUS | Status: AC | PRN
  Administered 2015-05-25: 1000 mL via INTRAVENOUS

## 2015-05-25 MED ORDER — EPINEPHRINE HCL 0.1 MG/ML IJ SOSY
PREFILLED_SYRINGE | INTRAMUSCULAR | Status: AC | PRN
  Administered 2015-05-25 (×7): 1 mg via INTRAVENOUS

## 2015-05-25 MED ORDER — CALCIUM CHLORIDE 10 % IV SOLN
INTRAVENOUS | Status: AC | PRN
  Administered 2015-05-25: 1 g via INTRAVENOUS

## 2015-05-25 MED ORDER — SODIUM BICARBONATE 8.4 % IV SOLN
INTRAVENOUS | Status: AC | PRN
  Administered 2015-05-25 (×2): 50 meq via INTRAVENOUS

## 2015-05-25 MED ORDER — TENECTEPLASE 50 MG IV KIT
45.0000 mg | PACK | Freq: Once | INTRAVENOUS | Status: AC
  Administered 2015-05-25: 45 mg via INTRAVENOUS
  Filled 2015-05-25: qty 10

## 2015-05-25 MED ORDER — ATROPINE SULFATE 1 MG/ML IJ SOLN
INTRAMUSCULAR | Status: AC | PRN
  Administered 2015-05-25 (×2): 1 mg via INTRAVENOUS

## 2015-05-27 MED FILL — Medication: Qty: 1 | Status: AC

## 2015-06-21 NOTE — Code Documentation (Signed)
Patient time of death occurred at 25

## 2015-06-21 NOTE — ED Provider Notes (Signed)
CSN: [id number]   Arrival date & time 21-Jun-2015 0002  History  This chart was scribed for Renee Loveless, MD by Bethel Born, ED Scribe. This patient was seen in room [facility] and the patient's care was started at 11:30M.  Chief Complaint  Patient presents with  . Cardiac Arrest  . Code STEMI    HPI The history is provided by the EMS personnel. No language interpreter was used.   Brought in by EMS with CPR in process, [name] is a 74 y.o. female with PMHx of HTN, asthma, and CKD (stage III) who presents to the Emergency Department in cardiac arrest. Initial EMS call was for sudden onset chest pain and SOB. On EMS arrival pt was diaphoretic and speaking in 1-2 word sentences. Pt coded in the parking lot in back of ambulance and was brought into the ED with CPR in progress. Dr. Zachery Conch (Cardiology) at the bedside.   Past Medical History  Diagnosis Date  . Depression   . Asthma   . GERD (gastroesophageal reflux disease)   . Hypertension     off all bp meds for last year  . Dysphagia     trouble swallowing since april 2015  . Arthritis   . Anemia   . History of blood transfusion 2015    Past Surgical History  Procedure Laterality Date  . Tooth extraction    . Joint replacement Left 2010    knee  . Abdominal hysterectomy  age 26  . Eye surgery  age 38    for cross eyed  . Esophagogastroduodenoscopy (egd) with propofol N/A 03/28/2014    Procedure: ESOPHAGOGASTRODUODENOSCOPY (EGD) WITH PROPOFOL;  Surgeon: Charolett Bumpers, MD;  Location: [facility] ENDOSCOPY;  Service: Endoscopy;  Laterality: N/A;  . Balloon dilation N/A 03/28/2014    Procedure: BALLOON DILATION;  Surgeon: Charolett Bumpers, MD;  Location: [facility] ENDOSCOPY;  Service: Endoscopy;  Laterality: N/A;  . Colonoscopy with propofol N/A 03/28/2014    Procedure: COLONOSCOPY WITH PROPOFOL;  Surgeon: Charolett Bumpers, MD;  Location: [facility] ENDOS[facility];  Service: Endoscopy;  Laterality: N/A;    No family history on file.  Social History   Substance Use Topics  . Smoking status: Never Smoker   . Smokeless tobacco: Never Used  . Alcohol Use: No     Review of Systems  Unable to perform ROS: Patient unresponsive    Home Medications   Prior to Admission medications   Medication Sig Start Date End Date Taking? Authorizing Provider  budesonide-formoterol (SYMBICORT) 160-4.5 MCG/ACT inhaler Inhale 2 puffs into the lungs 2 (two) times daily. 05/16/15   Renae Fickle, MD  buPROPion (WELLBUTRIN XL) 300 MG 24 hr tablet Take 300 mg by mouth daily. 07/24/14   Historical Provider, MD  dextromethorphan-guaiFENesin (MUCINEX DM) 30-600 MG per 12 hr tablet Take 1 tablet by mouth 2 (two) times daily. 05/16/15   Renae Fickle, MD  DULoxetine (CYMBALTA) 60 MG capsule Take 60 mg by mouth daily.    Historical Provider, MD  fluticasone (FLONASE) 50 MCG/ACT nasal spray Place 2 sprays into both nostrils daily. 05/16/15   Renae Fickle, MD  loratadine (CLARITIN) 10 MG tablet Take 1 tablet (10 mg total) by mouth daily. 05/16/15   Renae Fickle, MD  LORazepam (ATIVAN) 0.5 MG tablet Take 0.5 mg by mouth 2 (two) times daily as needed for anxiety.    Historical Provider, MD  montelukast (SINGULAIR) 10 MG tablet Take 10 mg by mouth at bedtime.    Historical Provider, MD  pantoprazole (PROTONIX) 40 MG tablet Take 1 tablet (40 mg total) by mouth daily at 12 noon. 05/16/15   Renae Fickle, MD  predniSONE (DELTASONE) 20 MG tablet Take 2 tabs daily x 2 days, 1 tab daily x 2 days, half tab daily for 2 days, then top. 05/16/15   Renae Fickle, MD  PROAIR HFA 108 (90 BASE) MCG/ACT inhaler Inhale 2 puffs into the lungs every 4 (four) hours as needed. 07/24/14   Historical Provider, MD  sodium chloride (OCEAN) 0.65 % SOLN nasal spray Place 2 sprays into both nostrils 2 (two) times daily. 05/16/15   Renae Fickle, MD  traMADol (ULTRAM) 50 MG tablet Take 50 mg by mouth every 6 (six) hours as needed for pain.    Historical Provider, MD    Allergies  Aspirin  and Codeine  Triage Vitals: Wt 177 lb (80.287 kg)  Physical Exam  Constitutional: She appears well-developed and well-nourished.  HENT:  Head: Normocephalic and atraumatic.  Right Ear: External ear normal.  Left Ear: External ear normal.  Nose: Nose normal.  Eyes: Right eye exhibits no discharge. Left eye exhibits no discharge.  Cardiovascular:  Pulses:      Carotid pulses are 0 on the right side, and 0 on the left side.      Femoral pulses are 0 on the right side, and 0 on the left side. No heart beat auscultated  Pulmonary/Chest: Breath sounds normal.  Assisted ventilations with BVM  Abdominal: Soft. She exhibits no distension.  Neurological: She is unresponsive. GCS eye subscore is 1. GCS verbal subscore is 1. GCS motor subscore is 1.  Skin: Skin is warm and dry.  Nursing note and vitals reviewed.   ED Course  IO LINE INSERTION Date/Time: 06-14-15 4:26 PM Performed by: [name] Authorized by: [name] Consent: The procedure was performed in an emergent situation. Patient identity confirmed: anonymous protocol, patient vented/unresponsive Indications: hemodynamic instability, medication administration and rapid vascular access Local anesthesia used: no Patient sedated: no Insertion site: left proximal tibia Site preparation: chlorhexidine Preparation: Patient was prepped and draped in the usual sterile fashion. Insertion device: drill device Insertion: needle was inserted through the bony cortex Number of attempts: 1 Confirmation method: stability of the needle, easy infusion of fluids and aspiration of blood/marrow Patient tolerance: Patient tolerated the procedure well with no immediate complications    INTUBATION Performed ZO:XWRUE Criss Alvine, MD Required items: required blood products, implants, devices, and special equipment available Patient identity confirmed: provided demographic data and hospital-assigned identification number Time out:  Immediately prior to procedure a "time out" was called to verify the correct patient, procedure, equipment, support staff and site/side marked as required.  Indications: respiratory failure/cardiac arrest  Intubation method: Glidescope Laryngoscopy , 1 attempt  Preoxygenation: BVM  Tube Size: 7.5 cuffed  Post-procedure assessment: chest rise and ETCO2 monitor Breath sounds: equal and absent over the epigastrium Tube secured with: ETT holder  Patient tolerated the procedure well with no immediate complications.   COORDINATION OF CARE:  12:32 AM D/w Dr. Tresa Endo at the bedside.   1:18 AM-Consult complete with Dr. Julien Girt (Medical Examiner). Patient case explained and discussed. Call ended at 1:19 AM   Labs Reviewed - No data to display Imaging Review No results found.   Cardiopulmonary Resuscitation (CPR) Procedure Note Directed/Performed by: Audree Camel I personally directed ancillary staff and/or performed CPR in an effort to regain return of spontaneous circulation and to maintain cardiac, neuro and systemic perfusion.     MDM  Final diagnoses:  Cardiac arrest    Patient with cardiac arrest on arrival to ED. Initially called out as code STEMI though I was unable to see EKG until midway through resuscitation. Initial rhythm asystole. Occasionally had PEA. Transiently has ROSC but quickly deteriorated again. While resuscitating EMS provided more information, including that she was hypoxic to 70% with clear lungs. Recently discharged after hospital admission about 1 week ago for asthma exacerbation. When this was found out concern for PE was quite high. Given thrombolytic bolus. CPR continued after thrombolysis but no ROSC or signs of life. Bedside ultrasound by myself and cardiology shows dilated RV but no cardiac activity or significant effusion. D/w Medical examiner but this is not an ME case. I discussed case and patient's demise with the sister in law who is the only  relative/family member noted in chart. Later I also discussed with brother at bedside and answered questions to the best of my ability.   I personally performed the services described in this documentation, which was scribed in my presence. The recorded information has been reviewed and is accurate.    Renee Loveless, MD 2015/06/20 [phone]

## 2015-06-21 NOTE — ED Notes (Signed)
Family at bedside. 

## 2015-06-21 NOTE — Progress Notes (Signed)
Chaplain was in the  ED when Code Stemi called, upon arrival to the ED the patient was receiving CPR.  After much medical intervention from the ED medical staff the patient could not be revived and her death was pronounced.  Prior to her death Chaplain had made a call to her emergency contact to inform her of the patient's presence in the ED.  Another call by the Chaplain was made after the death to request her presence at the hospital, transporation arrangement had to be made because the emergency contact didn't drive at night.  The family was informed by the DR of the death and they was given an opportunity to pay their their respect to their loved one.  Chaplain informed there family of all needed information needed by the Nursing staff for end of life procedures.  The family was appreciative of all support brom the staff. Chaplain Janell Quiet [phone]

## 2015-06-21 NOTE — ED Notes (Signed)
Per [facility], pt c/o SOB and cp today. Was only able to speak 1-2 words, lungs were clear per EMS. Was responsive with them. Upon arrival to the ED and backing in, pt went unresponsive and lost pulses. Started CPR in the back of the truck.

## 2015-06-21 DEATH — deceased

## 2015-06-26 MED FILL — Medication: Qty: 1 | Status: AC

## 2017-03-29 IMAGING — CR DG CHEST 2V
2 series · 2 of 2 positions shown · non-contrast
Comparison: 07/30/2014.  02/22/2008.

CLINICAL DATA: Cough. Asthma. Short of breath for 1 week. Initial
encounter.

EXAM:
CHEST  2 VIEW

[chest pa]
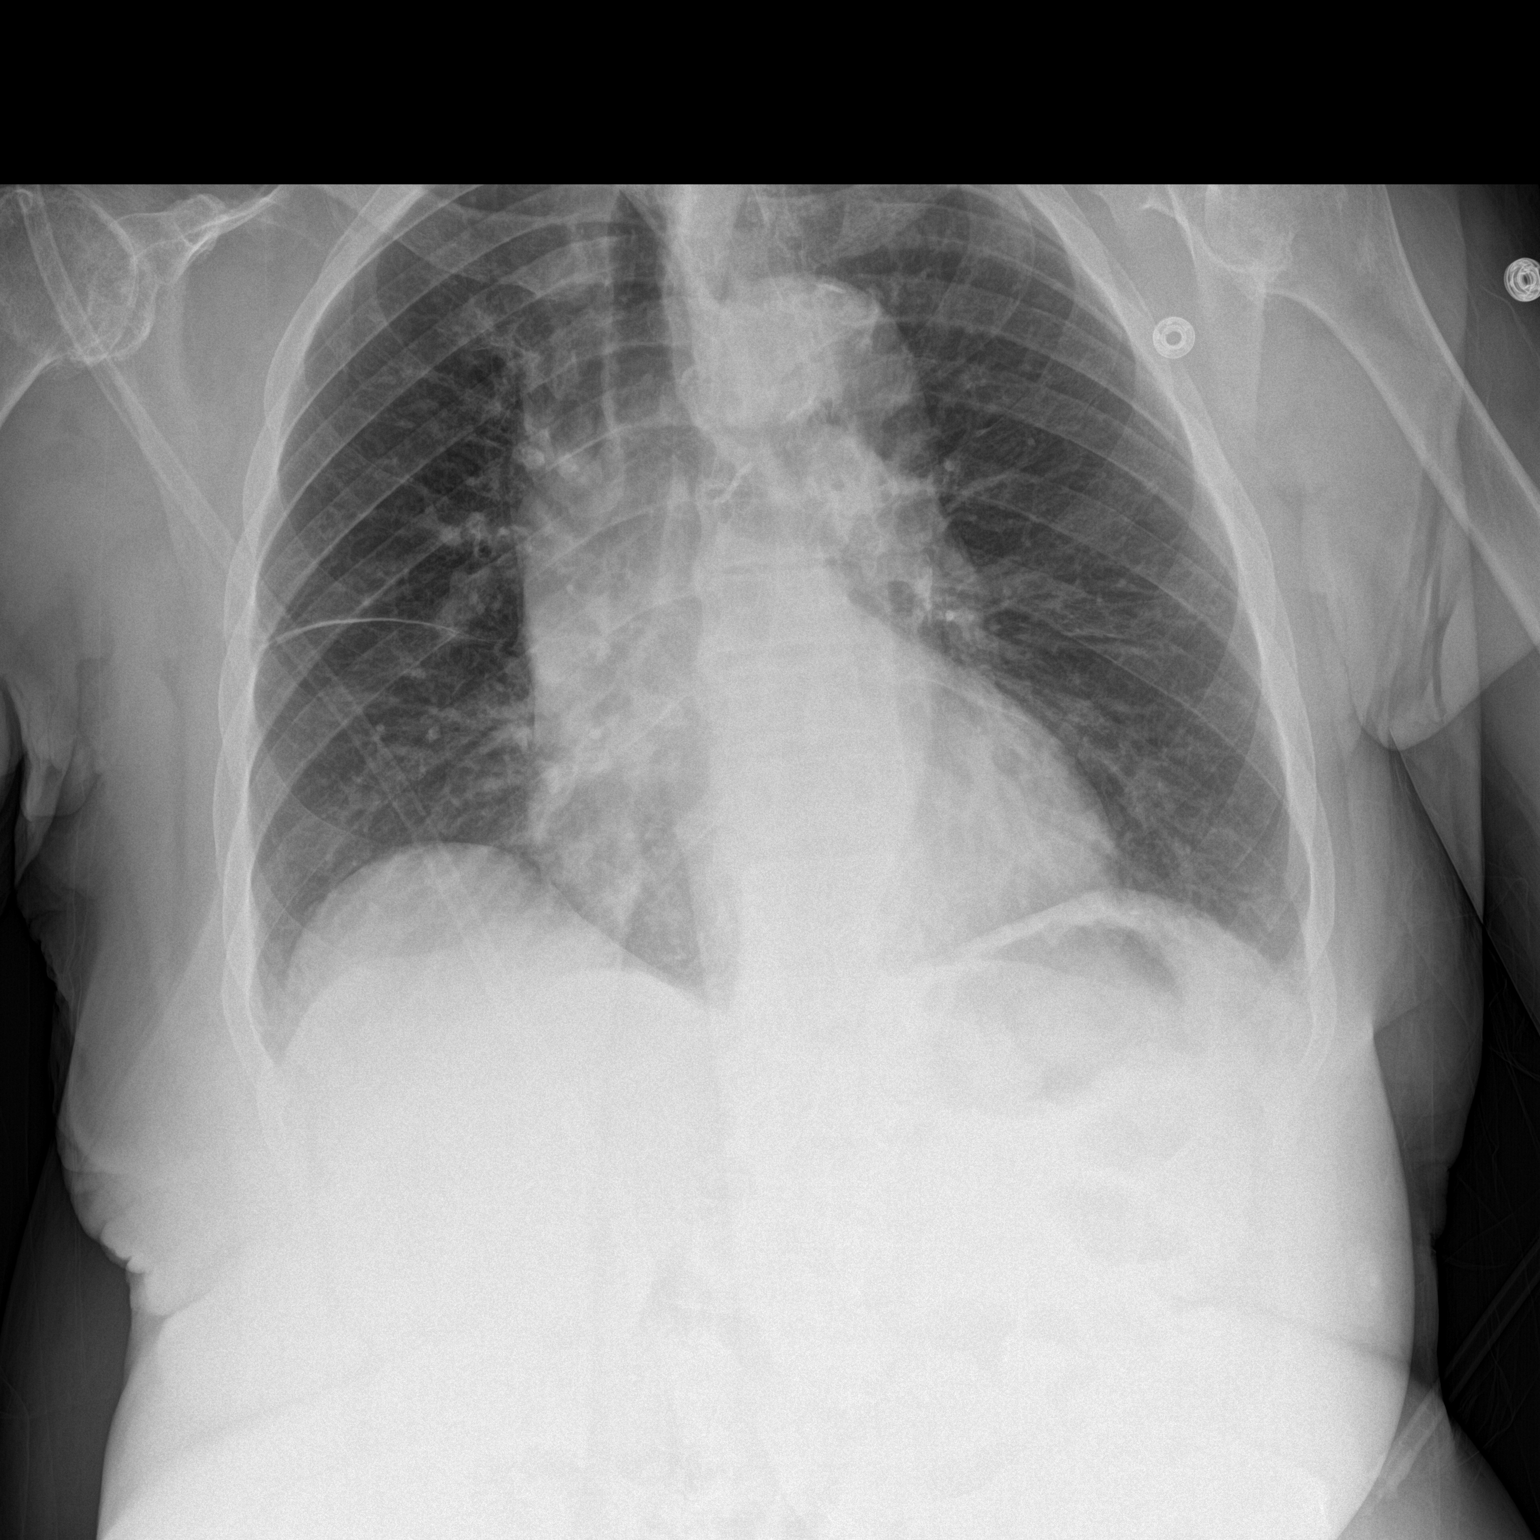

[chest lat]
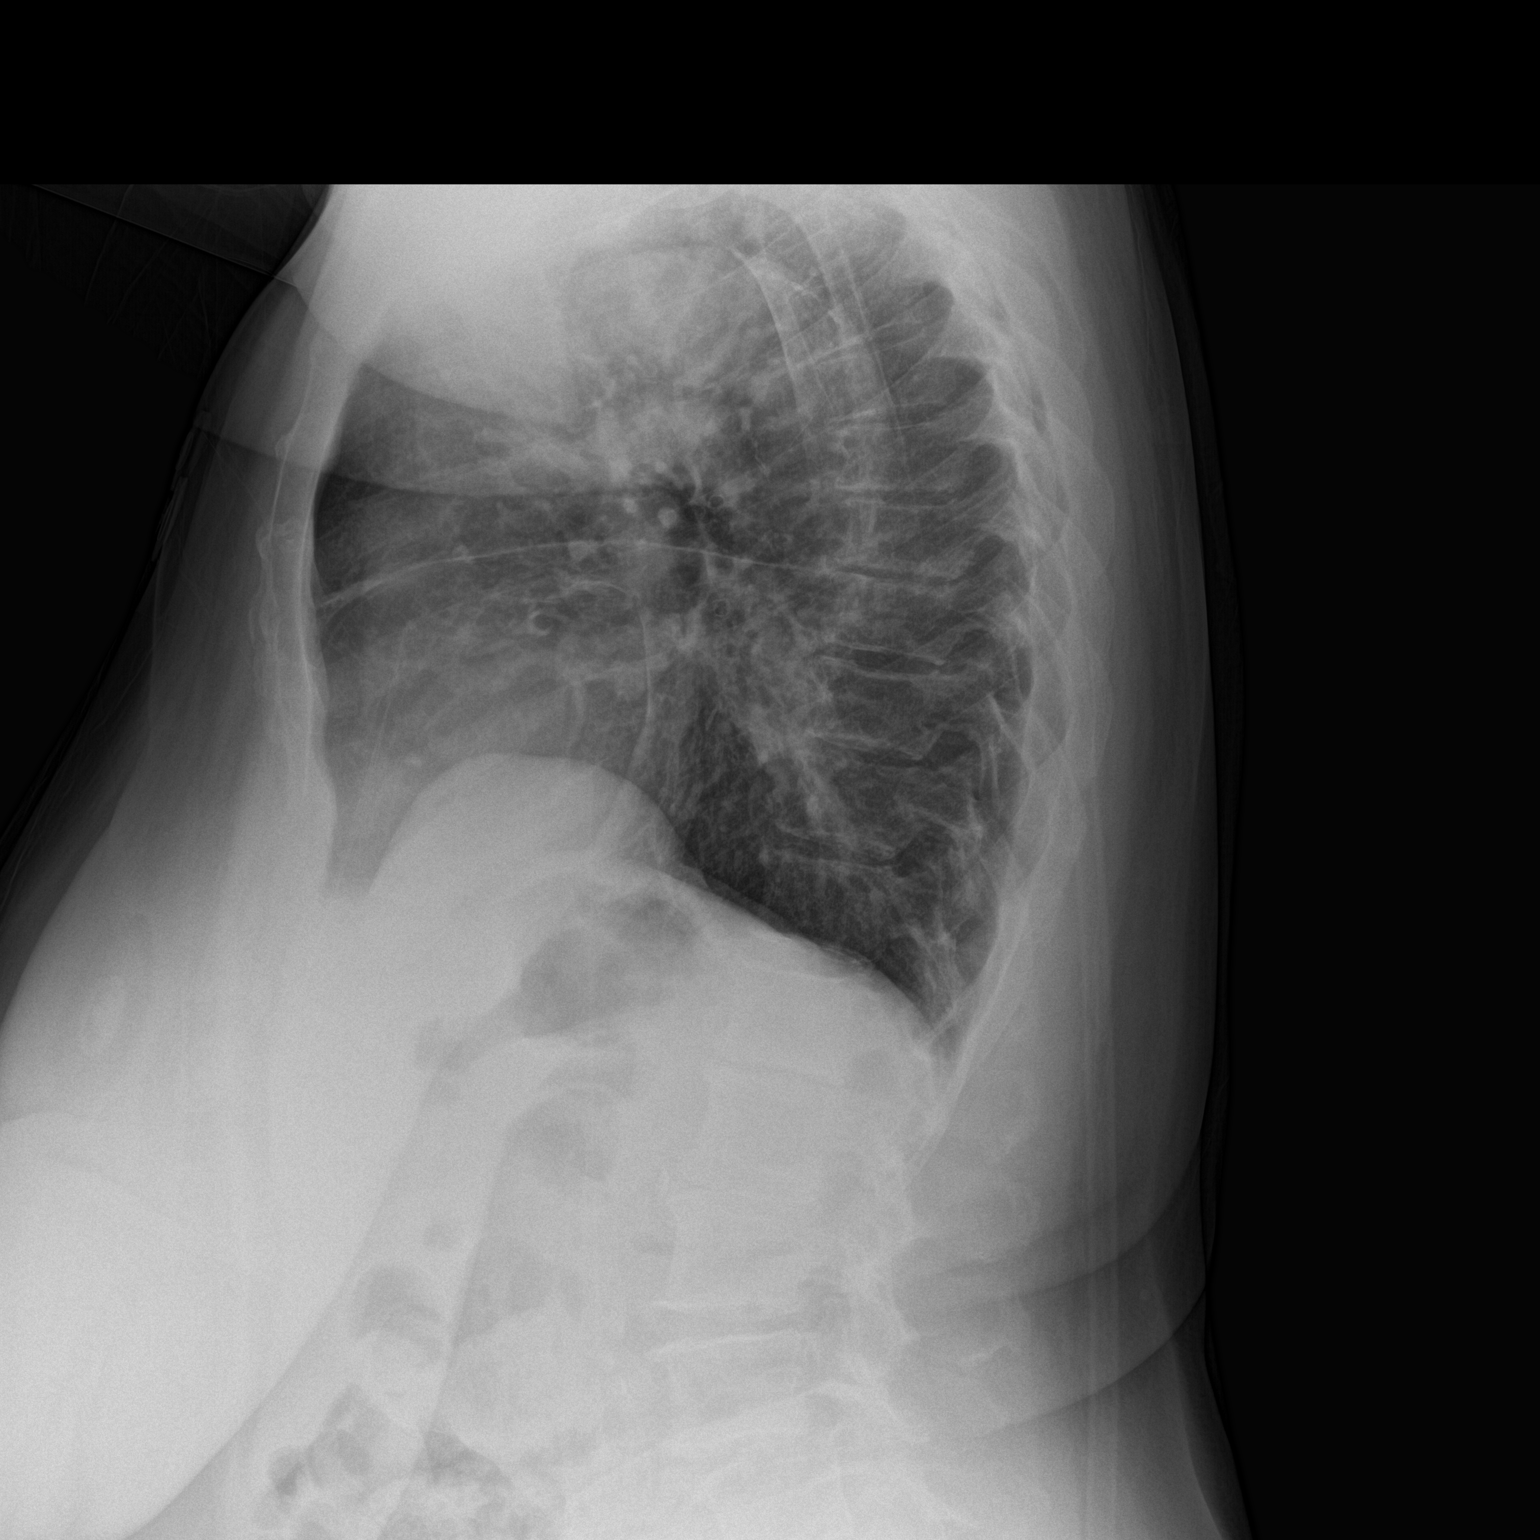

[2 of 2 positions shown; findings below may reference images not displayed]

FINDINGS: Heart size within normal limits for projection. Chronic tortuosity
of the thoracic aorta with arch atherosclerosis. Chronic pleural
thickening/ scarring. No airspace disease. No pleural effusion.
IMPRESSION: Chronic changes of the chest without acute cardiopulmonary disease.
